# Patient Record
Sex: Male | Born: 1985 | Race: Black or African American | Hispanic: No | Marital: Married | Smoking: Never smoker
Health system: Southern US, Community
[De-identification: ages and names within clinical notes are randomized; demographics above are authoritative.]

## PROBLEM LIST (undated history)

## (undated) DIAGNOSIS — I10 Essential (primary) hypertension: Secondary | ICD-10-CM

## (undated) HISTORY — PX: KNEE SURGERY: SHX244

---

## 2007-02-06 ENCOUNTER — Emergency Department (HOSPITAL_COMMUNITY): Admission: EM | Admit: 2007-02-06 | Discharge: 2007-02-06 | Payer: Self-pay | Admitting: Emergency Medicine

## 2008-11-15 ENCOUNTER — Emergency Department (HOSPITAL_COMMUNITY): Admission: EM | Admit: 2008-11-15 | Discharge: 2008-11-15 | Payer: Self-pay | Admitting: Emergency Medicine

## 2008-11-28 ENCOUNTER — Emergency Department (HOSPITAL_COMMUNITY): Admission: EM | Admit: 2008-11-28 | Discharge: 2008-11-28 | Payer: Self-pay | Admitting: Emergency Medicine

## 2009-04-26 ENCOUNTER — Emergency Department (HOSPITAL_BASED_OUTPATIENT_CLINIC_OR_DEPARTMENT_OTHER): Admission: EM | Admit: 2009-04-26 | Discharge: 2009-04-26 | Payer: Self-pay | Admitting: Emergency Medicine

## 2015-05-17 ENCOUNTER — Emergency Department (HOSPITAL_COMMUNITY)
Admission: EM | Admit: 2015-05-17 | Discharge: 2015-05-17 | Disposition: A | Discharge status: Home / Self Care | Payer: 59 | Attending: Emergency Medicine | Admitting: Emergency Medicine

## 2015-05-17 ENCOUNTER — Encounter (HOSPITAL_COMMUNITY): Payer: Self-pay | Admitting: Emergency Medicine

## 2015-05-17 DIAGNOSIS — Y9389 Activity, other specified: Secondary | ICD-10-CM | POA: Insufficient documentation

## 2015-05-17 DIAGNOSIS — Y999 Unspecified external cause status: Secondary | ICD-10-CM | POA: Diagnosis not present

## 2015-05-17 DIAGNOSIS — I10 Essential (primary) hypertension: Secondary | ICD-10-CM | POA: Insufficient documentation

## 2015-05-17 DIAGNOSIS — Y92410 Unspecified street and highway as the place of occurrence of the external cause: Secondary | ICD-10-CM | POA: Diagnosis not present

## 2015-05-17 DIAGNOSIS — M546 Pain in thoracic spine: Secondary | ICD-10-CM

## 2015-05-17 DIAGNOSIS — S299XXA Unspecified injury of thorax, initial encounter: Secondary | ICD-10-CM | POA: Insufficient documentation

## 2015-05-17 DIAGNOSIS — S3992XA Unspecified injury of lower back, initial encounter: Secondary | ICD-10-CM | POA: Diagnosis present

## 2015-05-17 DIAGNOSIS — M545 Low back pain, unspecified: Secondary | ICD-10-CM

## 2015-05-17 MED ORDER — KETOROLAC TROMETHAMINE 60 MG/2ML IM SOLN
60.0000 mg | Freq: Once | INTRAMUSCULAR | Status: AC
  Administered 2015-05-17: 60 mg via INTRAMUSCULAR
  Filled 2015-05-17: qty 2

## 2015-05-17 MED ORDER — DIAZEPAM 2 MG PO TABS
2.0000 mg | ORAL_TABLET | Freq: Once | ORAL | Status: AC
  Administered 2015-05-17: 2 mg via ORAL
  Filled 2015-05-17: qty 1

## 2015-05-17 MED ORDER — CYCLOBENZAPRINE HCL 10 MG PO TABS: 10.0000 mg | ORAL_TABLET | Freq: Two times a day (BID) | ORAL | Status: DC | PRN

## 2015-05-17 MED ORDER — IBUPROFEN 800 MG PO TABS: 800.0000 mg | ORAL_TABLET | Freq: Three times a day (TID) | ORAL | Status: DC

## 2015-05-17 MED ORDER — HYDROCODONE-ACETAMINOPHEN 5-325 MG PO TABS: 1.0000 | ORAL_TABLET | ORAL | Status: DC | PRN
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
## 2015-05-17 NOTE — Progress Notes (Signed)
  [facility] ED CM spoke with pt on how to obtain an in network pcp with insurance coverage via the customer service number or web site His wife works for [employer] Fast track RN, Maryruth Hancocknn Marie spoke with pt about getting wife to assist with finding a doctor to f/u with  Cm reviewed ED level of care for crisis/emergent services and community pcp level of care to manage continuous or chronic medical concerns.  The pt voiced understanding CM encouraged pt and discussed pt's responsibility to verify with pt's insurance carrier that any recommended medical provider offered by any emergency room or a hospital provider is within the carrier's network. The pt voiced understanding

## 2015-10-21 ENCOUNTER — Emergency Department (HOSPITAL_COMMUNITY)
Admission: EM | Admit: 2015-10-21 | Discharge: 2015-10-21 | Disposition: A | Discharge status: Home / Self Care | Payer: Medicaid Other | Attending: Emergency Medicine | Admitting: Emergency Medicine

## 2015-10-21 ENCOUNTER — Encounter (HOSPITAL_COMMUNITY): Payer: Self-pay

## 2015-10-21 ENCOUNTER — Emergency Department (HOSPITAL_COMMUNITY): Payer: Medicaid Other

## 2015-10-21 DIAGNOSIS — Y9389 Activity, other specified: Secondary | ICD-10-CM | POA: Diagnosis not present

## 2015-10-21 DIAGNOSIS — S60121A Contusion of right index finger with damage to nail, initial encounter: Secondary | ICD-10-CM | POA: Insufficient documentation

## 2015-10-21 DIAGNOSIS — Z791 Long term (current) use of non-steroidal anti-inflammatories (NSAID): Secondary | ICD-10-CM | POA: Diagnosis not present

## 2015-10-21 DIAGNOSIS — S6000XA Contusion of unspecified finger without damage to nail, initial encounter: Secondary | ICD-10-CM

## 2015-10-21 DIAGNOSIS — S6010XA Contusion of unspecified finger with damage to nail, initial encounter: Secondary | ICD-10-CM

## 2015-10-21 DIAGNOSIS — S6991XA Unspecified injury of right wrist, hand and finger(s), initial encounter: Secondary | ICD-10-CM | POA: Diagnosis present

## 2015-10-21 DIAGNOSIS — I10 Essential (primary) hypertension: Secondary | ICD-10-CM | POA: Diagnosis not present

## 2015-10-21 DIAGNOSIS — Y998 Other external cause status: Secondary | ICD-10-CM | POA: Insufficient documentation

## 2015-10-21 DIAGNOSIS — W231XXA Caught, crushed, jammed, or pinched between stationary objects, initial encounter: Secondary | ICD-10-CM | POA: Diagnosis not present

## 2015-10-21 DIAGNOSIS — Y9289 Other specified places as the place of occurrence of the external cause: Secondary | ICD-10-CM | POA: Insufficient documentation

## 2015-10-21 HISTORY — DX: Essential (primary) hypertension: I10

## 2015-10-21 MED ORDER — HYDROCODONE-ACETAMINOPHEN 5-325 MG PO TABS
1.0000 | ORAL_TABLET | Freq: Once | ORAL | Status: AC
  Administered 2015-10-21: 1 via ORAL
  Filled 2015-10-21: qty 1

## 2015-10-21 MED ORDER — HYDROCODONE-ACETAMINOPHEN 5-325 MG PO TABS: 2.0000 | ORAL_TABLET | ORAL | Status: DC | PRN

## 2015-10-21 NOTE — Discharge Instructions (Signed)
Contusion A contusion is a deep bruise. Contusions are the result of a blunt injury to tissues and muscle fibers under the skin. The injury causes bleeding under the skin. The skin overlying the contusion may turn blue, purple, or yellow. Minor injuries will give you a painless contusion, but more severe contusions may stay painful and swollen for a few weeks.  CAUSES  This condition is usually caused by a blow, trauma, or direct force to an area of the body. SYMPTOMS  Symptoms of this condition include:  Swelling of the injured area.  Pain and tenderness in the injured area.  Discoloration. The area may have redness and then turn blue, purple, or yellow. DIAGNOSIS  This condition is diagnosed based on a physical exam and medical history. An X-ray, CT scan, or MRI may be needed to determine if there are any associated injuries, such as broken bones (fractures). TREATMENT  Specific treatment for this condition depends on what area of the body was injured. In general, the best treatment for a contusion is resting, icing, applying pressure to (compression), and elevating the injured area. This is often called the RICE strategy. Over-the-counter anti-inflammatory medicines may also be recommended for pain control.  HOME CARE INSTRUCTIONS   Rest the injured area.  If directed, apply ice to the injured area:  Put ice in a plastic bag.  Place a towel between your skin and the bag.  Leave the ice on for 20 minutes, 2-3 times per day.  If directed, apply light compression to the injured area using an elastic bandage. Make sure the bandage is not wrapped too tightly. Remove and reapply the bandage as directed by your health care provider.  If possible, raise (elevate) the injured area above the level of your heart while you are sitting or lying down.  Take over-the-counter and prescription medicines only as told by your health care provider. SEEK MEDICAL CARE IF:  Your symptoms do not  improve after several days of treatment.  Your symptoms get worse.  You have difficulty moving the injured area. SEEK IMMEDIATE MEDICAL CARE IF:   You have severe pain.  You have numbness in a hand or foot.  Your hand or foot turns pale or cold.   This information is not intended to replace advice given to you by your health care provider. Make sure you discuss any questions you have with your health care provider.   Document Released: 08/05/2005 Document Revised: 07/17/2015 Document Reviewed: 03/13/2015 [employer] Interactive Patient Education 2016 [employer]   Subungual Hematoma A subungual hematoma is a pocket of blood that collects under the fingernail or toenail. The pressure created by the blood under the nail can cause pain. CAUSES  A subungual hematoma occurs when an injury to the finger or toe causes a blood vessel beneath the nail to break. The injury can occur from a direct blow such as slamming a finger in a door. It can also occur from a repeated injury such as pressure on the foot in a shoe while running. A subungual hematoma is sometimes called runner's toe or tennis toe. SYMPTOMS   Blue or dark blue skin under the nail.  Pain or throbbing in the injured area. DIAGNOSIS  Your caregiver can determine whether you have a subungual hematoma based on your history and a physical exam. If your caregiver thinks you might have a broken (fractured) bone, X-rays may be taken. TREATMENT  Hematomas usually go away on their own over time. Your caregiver may  make a hole in the nail to drain the blood. Draining the blood is painless and usually provides significant relief from pain and throbbing. The nail usually grows back normally after this procedure. In some cases, the nail may need to be removed. This is done if there is a cut under the nail that requires stitches (sutures). HOME CARE INSTRUCTIONS   Put ice on the injured area.  Put ice in a plastic bag.  Place a towel  between your skin and the bag.  Leave the ice on for 15-20 minutes, 03-04 times a day for the first 1 to 2 days.  Elevate the injured area to help decrease pain and swelling.  If you were given a bandage, wear it for as long as directed by your caregiver.  If part of your nail falls off, trim the remaining nail gently. This prevents the nail from catching on something and causing further injury.  Only take over-the-counter or prescription medicines for pain, discomfort, or fever as directed by your caregiver. SEEK IMMEDIATE MEDICAL CARE IF:   You have redness or swelling around the nail.  You have yellowish-white fluid (pus) coming from the nail.  Your pain is not controlled with medicine.  You have a fever. MAKE SURE YOU:  Understand these instructions.  Will watch your condition.  Will get help right away if you are not doing well or get worse.   This information is not intended to replace advice given to you by your health care provider. Make sure you discuss any questions you have with your health care provider.   Apply ice to affected area. Follow up with orthopedic provider if symptoms do not improve within 1-2 weeks. Return to the Emergency Department if you experience worsening of your symptoms, fever, numbness in you extremity.
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
## 2015-10-21 NOTE — ED Provider Notes (Signed)
CSN: [id number]     Arrival date & time 10/21/15  1302 History  By signing my name below, I, Jarvis Morganaylor Ferguson, attest that this documentation has been prepared under the direction and in the presence of Dub MikesSamantha Tripp Dowless, PA-C  Electronically Signed: Jarvis Morganaylor Ferguson, ED Scribe. 10/21/2015. 1:48 PM.    Chief Complaint  Patient presents with  . Finger Injury   The history is provided by the patient. No language interpreter was used.   HPI Comments: Michael Arnold is a 29 y.o. male who presents to the Emergency Department complaining of constant, moderate, throbbing, right index finger pain s/p slamming tip of right index finger in a door today. He reports associated swelling and bruising to the finger. He states the pain is exacerbated with moving the finger and applied pressure to the finger. Pt denies any alleviating factors. He denies any prior injury to the finger. He denies any numbness, tingling, weakness or other associated symptoms at this time.  Past Medical History  Diagnosis Date  . Hypertension    Past Surgical History  Procedure Laterality Date  . Knee surgery     History reviewed. No pertinent family history. Social History  Substance Use Topics  . Smoking status: Never Smoker   . Smokeless tobacco: None  . Alcohol Use: No    Review of Systems  All other systems reviewed and are negative.     Allergies  Review of patient's allergies indicates no known allergies.  Home Medications   Prior to Admission medications   Medication Sig Start Date End Date Taking? Authorizing Provider  cyclobenzaprine (FLEXERIL) 10 MG tablet Take 1 tablet (10 mg total) by mouth 2 (two) times daily as needed for muscle spasms. 05/17/15   Danelle BerryLeisa Tapia, PA-C  HYDROcodone-acetaminophen (NORCO/VICODIN) 5-325 MG per tablet Take 1-2 tablets by mouth every 4 (four) hours as needed. 05/17/15   Danelle BerryLeisa Tapia, PA-C  ibuprofen (ADVIL,MOTRIN) 800 MG tablet Take 1 tablet (800 mg total) by mouth 3  (three) times daily. 05/17/15   Danelle BerryLeisa Tapia, PA-C   Triage Vitals: BP 168/106 mmHg  Pulse 75  Temp(Src) 98.6 F (37 C) (Oral)  Resp 18  SpO2 97%  Physical Exam  Constitutional: He is oriented to person, place, and time. He appears well-developed and well-nourished. No distress.  HENT:  Head: Normocephalic and atraumatic.  Eyes: Conjunctivae are normal. Right eye exhibits no discharge. Left eye exhibits no discharge. No scleral icterus.  Cardiovascular: Normal rate.   Pulmonary/Chest: Effort normal.  Musculoskeletal:       Hands: R hand/R index finger:No evidence of tendon injury. No obvioius bony deformity. No decrease ROM.   Subungual hematoma present on R index finger.   Neurological: He is alert and oriented to person, place, and time. Coordination normal.  Skin: Skin is warm and dry. No rash noted. He is not diaphoretic. No erythema. No pallor.  Psychiatric: He has a normal mood and affect. His behavior is normal.  Nursing note and vitals reviewed.   ED Course  Procedures (including critical care time)  DIAGNOSTIC STUDIES: Oxygen Saturation is 97% on RA, normal by my interpretation.    COORDINATION OF CARE: 1:13 PM- Will order imaging of right index finger.  Pt advised of plan for treatment and pt agrees.  Labs Review Labs Reviewed - No data to display  Imaging Review Dg Finger Index Right  10/21/2015  CLINICAL DATA:  Close right index finger in car door this morning. Pain, bruising and swelling at DIP joint. EXAM:  RIGHT INDEX FINGER 2+V COMPARISON:  None. FINDINGS: There is no evidence of fracture or dislocation. There is no evidence of arthropathy or other focal bone abnormality. Soft tissues are unremarkable. IMPRESSION: Negative. Electronically Signed   By: Charlett Nose M.D.   On: 10/21/2015 13:35      EKG Interpretation None      MDM   Final diagnoses:  Finger contusion, initial encounter  Subungual hematoma of digit of hand, initial encounter     Patient X-Ray negative for obvious fracture or dislocation. Subungual hematoma present on R index finger. Pain managed in ED. Pt advised to follow up with orthopedics if symptoms persist for possibility of missed fracture diagnosis. Conservative therapy recommended and discussed. Patient will be dc home & is agreeable with above plan.   I personally performed the services described in this documentation, which was scribed in my presence. The recorded information has been reviewed and is accurate.      Lester Kinsman Goodridge, PA-C 10/22/15 1551  Tilden Fossa, MD 10/23/15 [phone]

## 2016-06-27 IMAGING — CR DG FINGER INDEX 2+V*R*
3 series · 3 of 3 positions shown · non-contrast
Comparison: None.

CLINICAL DATA: Close right index finger in car door this morning.
Pain, bruising and swelling at DIP joint.

EXAM:
RIGHT INDEX FINGER 2+V

[x finger pa right]
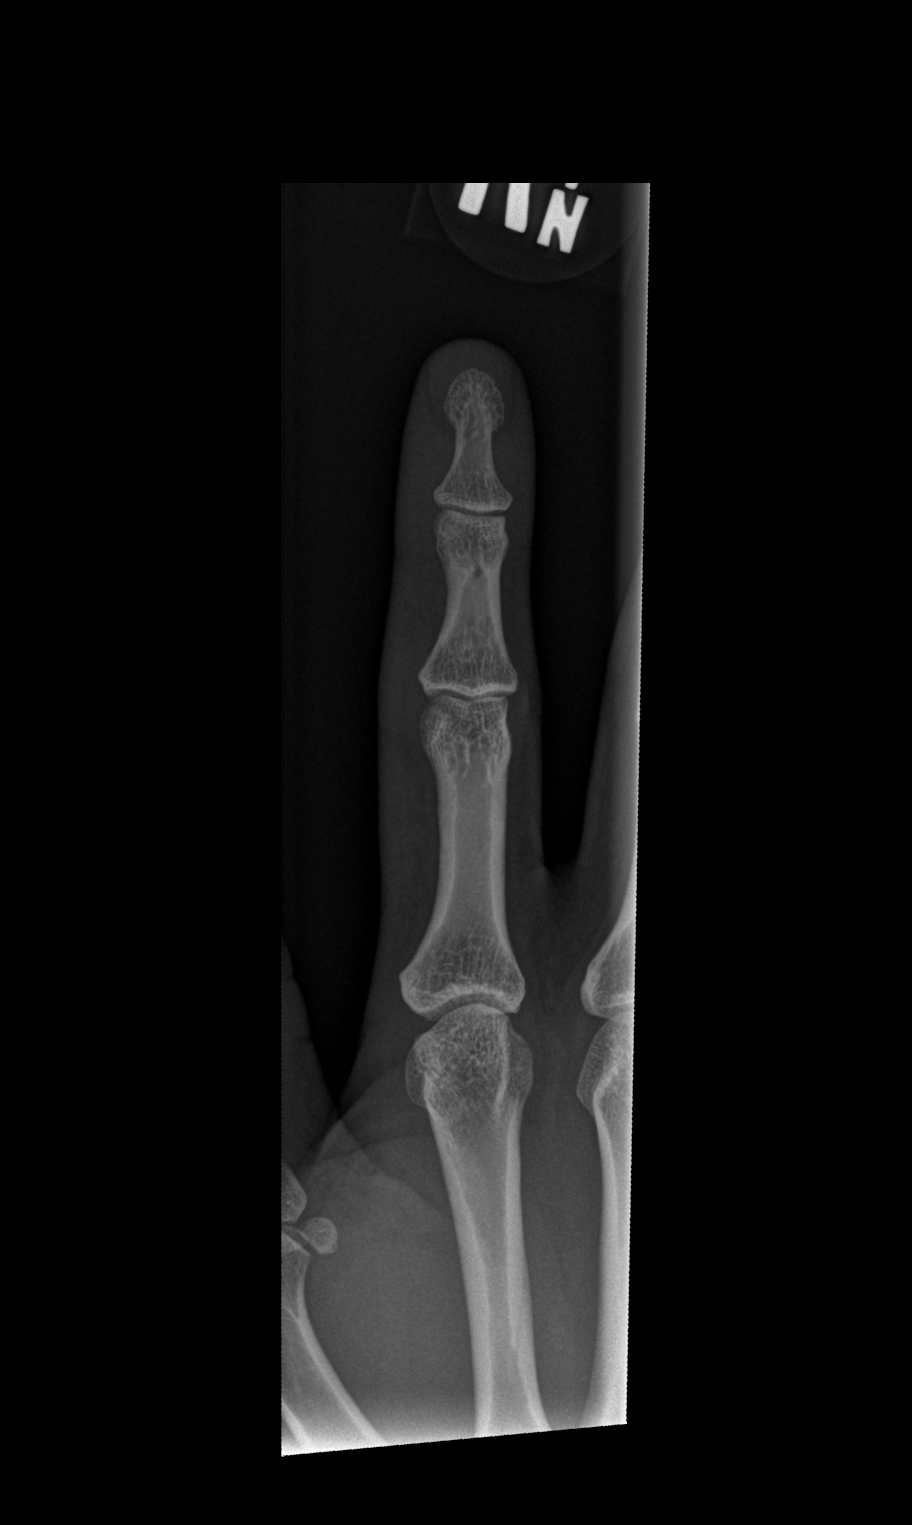

[x finger obl right]
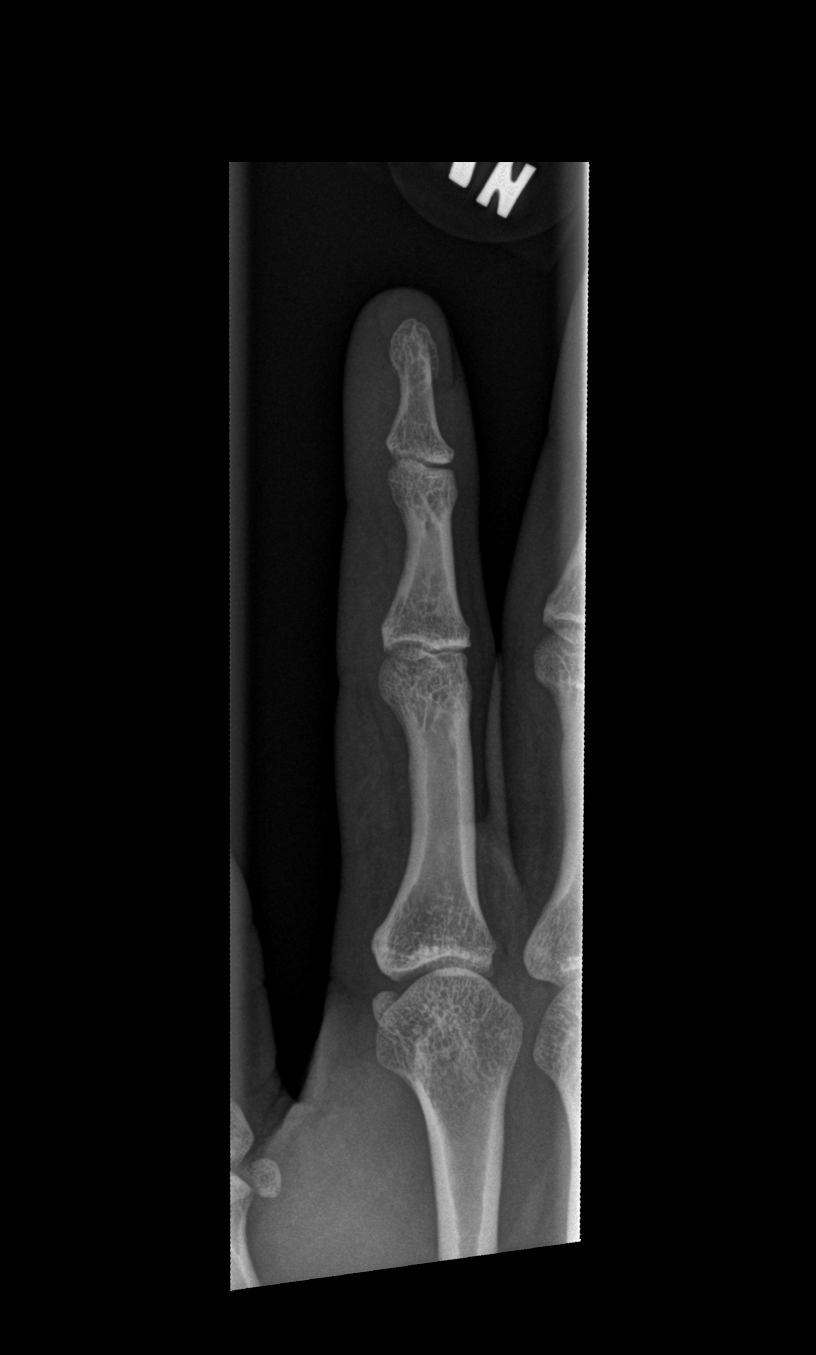

[x finger lat right]
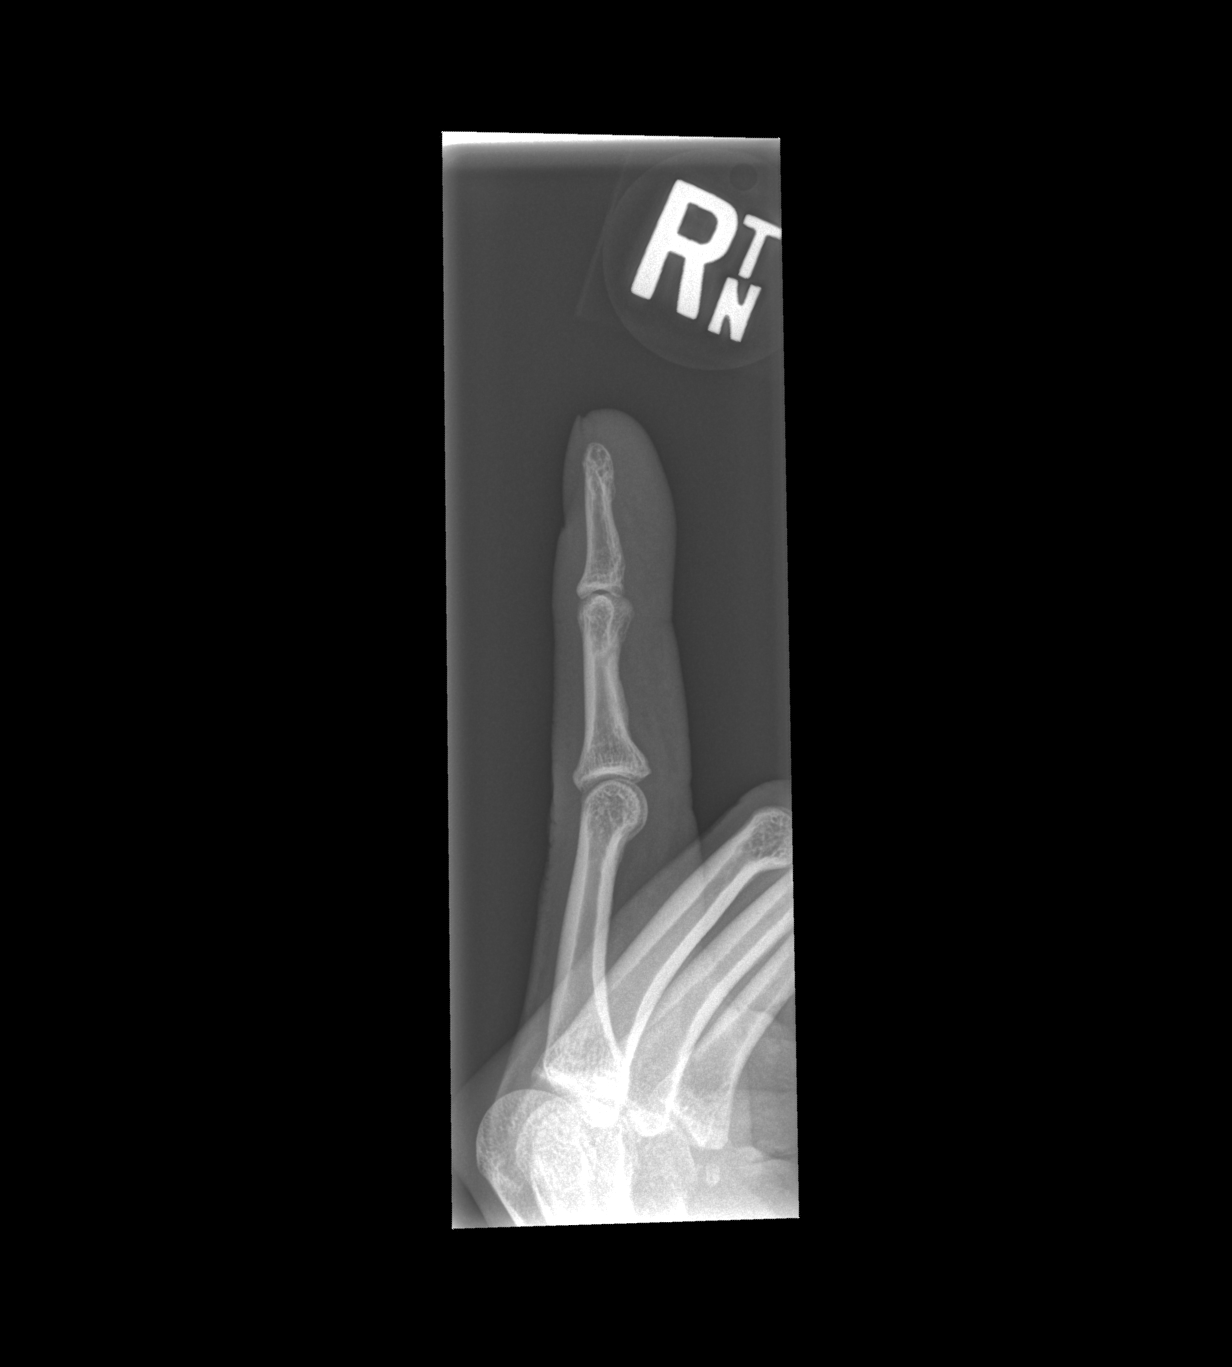

[3 of 3 positions shown; findings below may reference images not displayed]

FINDINGS: There is no evidence of fracture or dislocation. There is no
evidence of arthropathy or other focal bone abnormality. Soft
tissues are unremarkable.
IMPRESSION: Negative.

## 2018-04-27 ENCOUNTER — Other Ambulatory Visit: Payer: Self-pay

## 2018-04-27 ENCOUNTER — Emergency Department (HOSPITAL_COMMUNITY)
Admission: EM | Admit: 2018-04-27 | Discharge: 2018-04-28 | Disposition: A | Discharge status: Home / Self Care | Payer: Commercial Managed Care - PPO | Attending: Emergency Medicine | Admitting: Emergency Medicine

## 2018-04-27 ENCOUNTER — Encounter (HOSPITAL_COMMUNITY): Payer: Self-pay | Admitting: *Deleted

## 2018-04-27 DIAGNOSIS — J189 Pneumonia, unspecified organism: Secondary | ICD-10-CM | POA: Insufficient documentation

## 2018-04-27 DIAGNOSIS — I10 Essential (primary) hypertension: Secondary | ICD-10-CM | POA: Insufficient documentation

## 2018-04-27 DIAGNOSIS — R05 Cough: Secondary | ICD-10-CM | POA: Diagnosis present

## 2018-04-27 NOTE — ED Triage Notes (Signed)
Pt c/o cough x 1 week, productive & clear, & having headache.

## 2018-04-28 ENCOUNTER — Emergency Department (HOSPITAL_COMMUNITY): Payer: Commercial Managed Care - PPO

## 2018-04-28 MED ORDER — ONDANSETRON 4 MG PO TBDP: 4.0000 mg | ORAL_TABLET | Freq: Three times a day (TID) | ORAL | 0 refills | Status: DC | PRN

## 2018-04-28 MED ORDER — CEFTRIAXONE SODIUM 1 G IJ SOLR
1.0000 g | Freq: Once | INTRAMUSCULAR | Status: AC
  Administered 2018-04-28: 1 g via INTRAMUSCULAR
  Filled 2018-04-28: qty 10

## 2018-04-28 MED ORDER — ALBUTEROL SULFATE HFA 108 (90 BASE) MCG/ACT IN AERS
2.0000 | INHALATION_SPRAY | Freq: Once | RESPIRATORY_TRACT | Status: AC
  Administered 2018-04-28: 2 via RESPIRATORY_TRACT
  Filled 2018-04-28: qty 6.7

## 2018-04-28 MED ORDER — DOXYCYCLINE HYCLATE 100 MG PO CAPS: 100.0000 mg | ORAL_CAPSULE | Freq: Two times a day (BID) | ORAL | 0 refills | Status: AC

## 2018-04-28 MED ORDER — PROMETHAZINE-CODEINE 6.25-10 MG/5ML PO SYRP: 5.0000 mL | ORAL_SOLUTION | ORAL | 0 refills | Status: DC | PRN

## 2018-04-28 MED ORDER — LIDOCAINE HCL 1 % IJ SOLN
INTRAMUSCULAR | Status: AC
  Administered 2018-04-28: 20 mL
  Filled 2018-04-28: qty 20

## 2018-04-28 MED ORDER — HYDROCODONE-ACETAMINOPHEN 5-325 MG PO TABS
2.0000 | ORAL_TABLET | Freq: Once | ORAL | Status: AC
  Administered 2018-04-28: 2 via ORAL
  Filled 2018-04-28: qty 2

## 2018-04-28 MED ORDER — DOXYCYCLINE HYCLATE 100 MG PO TABS
100.0000 mg | ORAL_TABLET | Freq: Once | ORAL | Status: AC
  Administered 2018-04-28: 100 mg via ORAL
  Filled 2018-04-28: qty 1

## 2018-04-28 MED ORDER — PROMETHAZINE HCL 25 MG PO TABS
25.0000 mg | ORAL_TABLET | Freq: Once | ORAL | Status: AC
  Administered 2018-04-28: 25 mg via ORAL
  Filled 2018-04-28: qty 1

## 2018-04-28 NOTE — ED Provider Notes (Signed)
[facility]-EMERGENCY DEPT Provider Note   CSN: [id number] Arrival date & time: 04/27/18  2251     History   Chief Complaint Chief Complaint  Patient presents with  . Cough    HPI Michael Arnold is a 32 y.o. male.  HPI    32 year old well-appearing male here with cough.  The patient states that for the last week, he has had symptoms that initially started with chills, diffuse body aches, and cough.  His body aches and fevers have resolved but has had persistent cough with sputum production since then.  He is also had several episodes of post tussive emesis.  He has had poor appetite.  Is been going to work but states he has been very tired at work which is abnormal for him.  Denies any persistent fevers.  No known sick contacts.  Denies any pain currently.  No history of lung disease.  He does not smoke.  He does note that he has an occasional wheeze at home.  Denies any shortness of breath at rest.  No dyspnea with exertion.  No syncope.  Is been taking over-the-counter cough relief without significant improvement.  Past Medical History:  Diagnosis Date  . Hypertension     There are no active problems to display for this patient.   Past Surgical History:  Procedure Laterality Date  . KNEE SURGERY          Home Medications    Prior to Admission medications   Medication Sig Start Date End Date Taking? Authorizing Provider  cyclobenzaprine (FLEXERIL) 10 MG tablet Take 1 tablet (10 mg total) by mouth 2 (two) times daily as needed for muscle spasms. 05/17/15   Danelle Berryapia, Leisa, PA-C  doxycycline (VIBRAMYCIN) 100 MG capsule Take 1 capsule (100 mg total) by mouth 2 (two) times daily for 7 days. 04/28/18 05/05/18  Shaune PollackIsaacs, Kem Hensen, MD  HYDROcodone-acetaminophen (NORCO/VICODIN) 5-325 MG per tablet Take 1-2 tablets by mouth every 4 (four) hours as needed. 05/17/15   Danelle Berryapia, Leisa, PA-C  HYDROcodone-acetaminophen (NORCO/VICODIN) 5-325 MG tablet Take 2 tablets by mouth  every 4 (four) hours as needed. 10/21/15   Dowless, Lelon MastSamantha Tripp, PA-C  ibuprofen (ADVIL,MOTRIN) 800 MG tablet Take 1 tablet (800 mg total) by mouth 3 (three) times daily. 05/17/15   Danelle Berryapia, Leisa, PA-C  ondansetron (ZOFRAN ODT) 4 MG disintegrating tablet Take 1 tablet (4 mg total) by mouth every 8 (eight) hours as needed for nausea or vomiting. 04/28/18   Shaune PollackIsaacs, Anella Nakata, MD  promethazine-codeine (PHENERGAN WITH CODEINE) 6.25-10 MG/5ML syrup Take 5 mLs by mouth every 4 (four) hours as needed for cough. 04/28/18   Shaune PollackIsaacs, Jini Horiuchi, MD    Family History No family history on file.  Social History Social History   Tobacco Use  . Smoking status: Never Smoker  Substance Use Topics  . Alcohol use: No  . Drug use: No     Allergies   Patient has no known allergies.   Review of Systems Review of Systems  Constitutional: Positive for fatigue. Negative for chills and fever.  HENT: Negative for congestion and rhinorrhea.   Eyes: Negative for visual disturbance.  Respiratory: Positive for cough and shortness of breath. Negative for wheezing.   Cardiovascular: Negative for chest pain and leg swelling.  Gastrointestinal: Negative for abdominal pain, diarrhea, nausea and vomiting.  Genitourinary: Negative for dysuria and flank pain.  Musculoskeletal: Negative for neck pain and neck stiffness.  Skin: Negative for rash and wound.  Allergic/Immunologic: Negative for immunocompromised state.  Neurological: Positive for weakness. Negative for syncope and headaches.  All other systems reviewed and are negative.    Physical Exam Updated Vital Signs BP (!) 182/117 (BP Location: Right Arm)   Pulse 98   Temp 98.3 F (36.8 C) (Oral)   Resp 16   Ht 5\' 11"  (1.803 m)   Wt 88.5 kg (195 lb)   SpO2 96%   BMI 27.20 kg/m   Physical Exam  Constitutional: He is oriented to person, place, and time. He appears well-developed and well-nourished. No distress.  HENT:  Head: Normocephalic and atraumatic.    Mouth/Throat: Oropharynx is clear and moist.  Eyes: Conjunctivae are normal.  Neck: Neck supple.  Cardiovascular: Normal rate, regular rhythm and normal heart sounds. Exam reveals no friction rub.  No murmur heard. Pulmonary/Chest: Effort normal. No respiratory distress. He has no wheezes. He has rales (Mild, bibasilarly clear with coughing and occasional wheezes).  Abdominal: He exhibits no distension.  Musculoskeletal: He exhibits no edema.  Neurological: He is alert and oriented to person, place, and time. He exhibits normal muscle tone.  Skin: Skin is warm. Capillary refill takes less than 2 seconds.  Psychiatric: He has a normal mood and affect.  Nursing note and vitals reviewed.    ED Treatments / Results  Labs (all labs ordered are listed, but only abnormal results are displayed) Labs Reviewed - No data to display  EKG None  Radiology Dg Chest 2 View  Result Date: 04/28/2018 CLINICAL DATA:  32 y/o M; cough, congestion, and headache for 72 hours. Vomiting for 24 hours. EXAM: CHEST - 2 VIEW COMPARISON:  None. FINDINGS: Normal cardiac silhouette given projection and technique. Reticular opacities greatest in the lung bases. No consolidation. No pleural effusion or pneumothorax. Bones are unremarkable. IMPRESSION: Basilar predominant reticular opacities probably representing atypical pneumonia or bronchitis. No consolidation. Electronically Signed   By: Mitzi Hansen M.D.   On: 04/28/2018 00:40    Procedures Procedures (including critical care time)  Medications Ordered in ED Medications  cefTRIAXone (ROCEPHIN) injection 1 g (has no administration in time range)  doxycycline (VIBRA-TABS) tablet 100 mg (has no administration in time range)  lidocaine (XYLOCAINE) 1 % (with pres) injection (has no administration in time range)  HYDROcodone-acetaminophen (NORCO/VICODIN) 5-325 MG per tablet 2 tablet (2 tablets Oral Given 04/28/18 0048)  promethazine (PHENERGAN) tablet  25 mg (25 mg Oral Given 04/28/18 0048)  albuterol (PROVENTIL HFA;VENTOLIN HFA) 108 (90 Base) MCG/ACT inhaler 2 puff (2 puffs Inhalation Given 04/28/18 0049)     Initial Impression / Assessment and Plan / ED Course  I have reviewed the triage vital signs and the nursing notes.  Pertinent labs & imaging results that were available during my care of the patient were reviewed by me and considered in my medical decision making (see chart for details).     32 year old well-appearing male here with cough and sputum production for 1 week.  Chest x-ray is consistent with atypical pneumonia.  Patient is afebrile, hemodynamically stable and not hypoxic here.  Will treat with supportive care and a course of doxycycline.  Of also given him an IM dose of antibiotics here given his vomiting.  He was given Phenergan which has improved his nausea and is been able to eat and drink without difficulty.  Will also trial albuterol given his occasional wheezes.  Otherwise, patient is notably hypertensive here.  He states this is always the case when he is at the doctor and he declines any further work-up or  treatment.  No headache or signs of hypertensive urgency.  Final Clinical Impressions(s) / ED Diagnoses   Final diagnoses:  Community acquired pneumonia, unspecified laterality    ED Discharge Orders        Ordered    doxycycline (VIBRAMYCIN) 100 MG capsule  2 times daily     04/28/18 0137    promethazine-codeine (PHENERGAN WITH CODEINE) 6.25-10 MG/5ML syrup  Every 4 hours PRN     04/28/18 0137    ondansetron (ZOFRAN ODT) 4 MG disintegrating tablet  Every 8 hours PRN     04/28/18 0137       Shaune Pollack, MD 04/28/18 [phone]

## 2019-01-03 IMAGING — CR DG CHEST 2V
2 series · 2 of 2 positions shown · non-contrast
Comparison: None.

CLINICAL DATA: 31 y/o M; cough, congestion, and headache for 72
hours. Vomiting for 24 hours.

EXAM:
CHEST - 2 VIEW

[w chest pa]
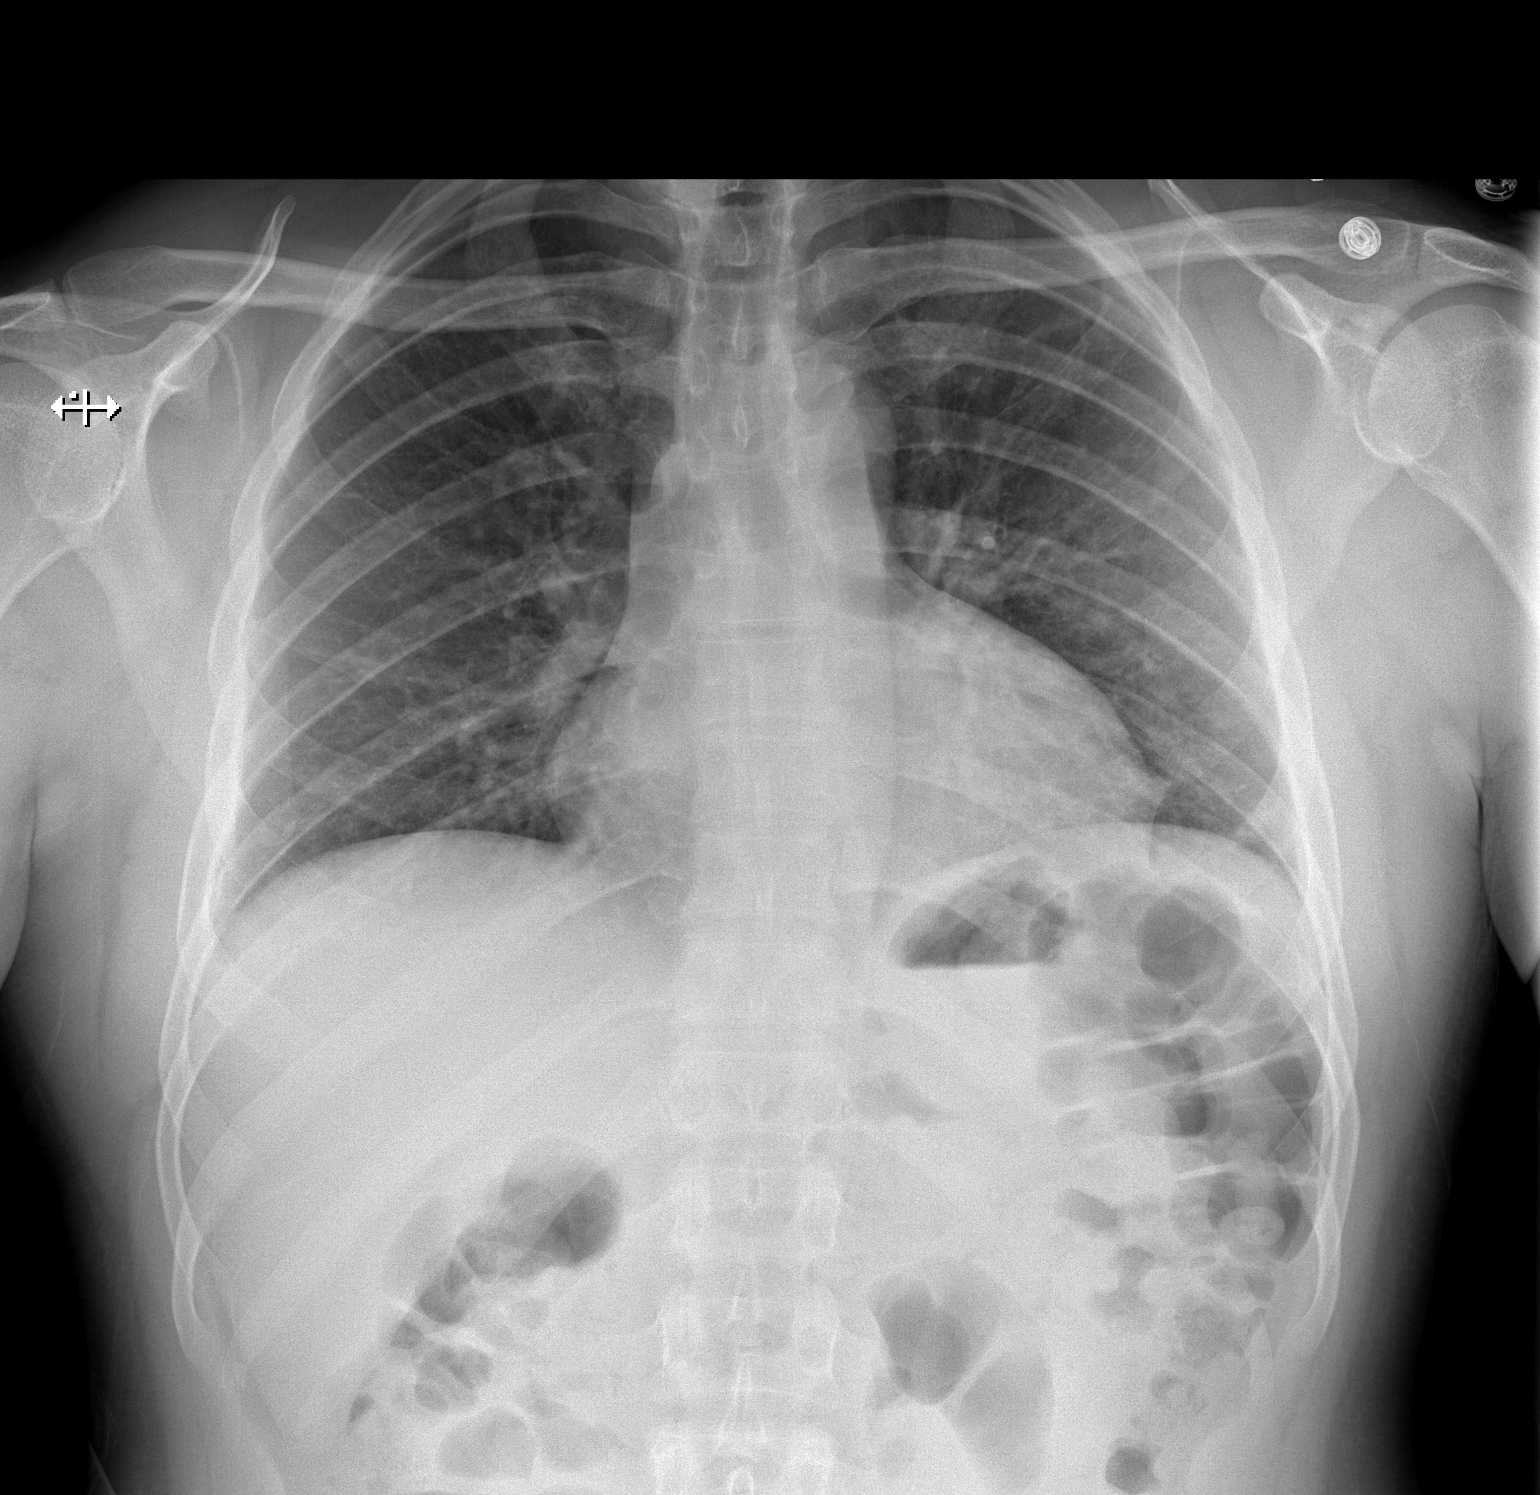

[w chest lat]
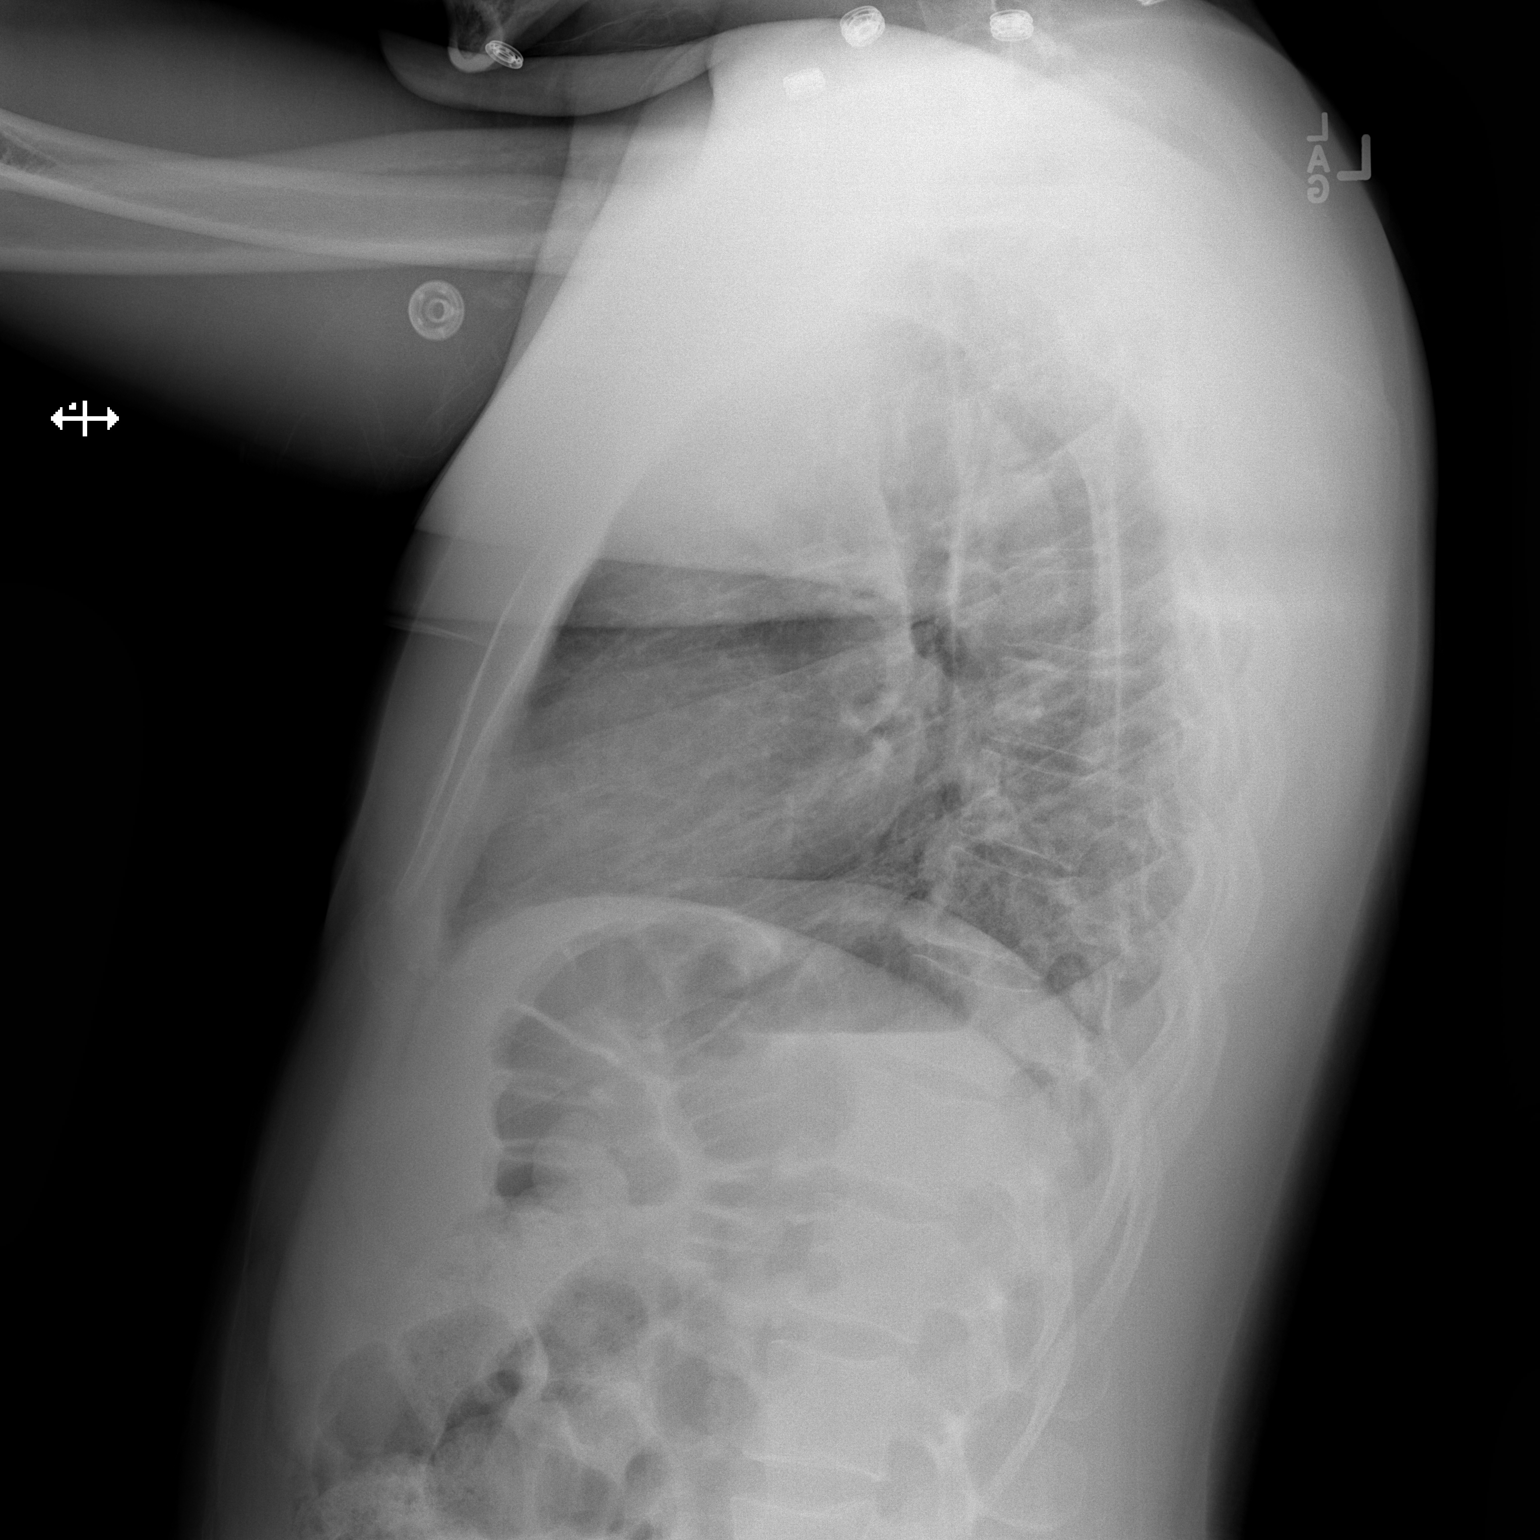

[2 of 2 positions shown; findings below may reference images not displayed]

FINDINGS: Normal cardiac silhouette given projection and technique. Reticular
opacities greatest in the lung bases. No consolidation. No pleural
effusion or pneumothorax. Bones are unremarkable.
IMPRESSION: Basilar predominant reticular opacities probably representing
atypical pneumonia or bronchitis. No consolidation.

By: Nadeem Grafton M.D.

## 2019-08-15 ENCOUNTER — Other Ambulatory Visit: Payer: Self-pay

## 2019-08-15 DIAGNOSIS — Z20822 Contact with and (suspected) exposure to covid-19: Secondary | ICD-10-CM

## 2019-08-18 LAB — NOVEL CORONAVIRUS, NAA: SARS-CoV-2, NAA: NOT DETECTED

## 2020-02-15 ENCOUNTER — Ambulatory Visit: Payer: Commercial Managed Care - PPO | Attending: Family

## 2020-02-15 DIAGNOSIS — Z23 Encounter for immunization: Secondary | ICD-10-CM

## 2020-02-15 NOTE — Progress Notes (Signed)
   Covid-19 Vaccination Clinic  Name:  Michael Arnold    MRN: [id number] DOB: October 17, 1986  02/15/2020  [name] was observed post Covid-19 immunization for 15 minutes without incident. He was provided with Vaccine Information Sheet and instruction to access the V-Safe system.   [name] was instructed to call 911 with any severe reactions post vaccine: Marland Kitchen Difficulty breathing  . Swelling of face and throat  . A fast heartbeat  . A bad rash all over body  . Dizziness and weakness   Immunizations Administered    Name Date Dose VIS Date Route   Moderna COVID-19 Vaccine 02/15/2020 11:02 AM 0.5 mL 10/10/2019 Intramuscular   Manufacturer: Moderna   Lot: [id number]   NDC: 46286-381-77

## 2020-03-19 ENCOUNTER — Ambulatory Visit: Payer: Commercial Managed Care - PPO | Attending: Family

## 2020-03-19 DIAGNOSIS — Z23 Encounter for immunization: Secondary | ICD-10-CM

## 2020-03-19 NOTE — Progress Notes (Signed)
   Covid-19 Vaccination Clinic  Name:  Slayton Lubitz    MRN: [id number] DOB: 1986/08/05  03/19/2020  Mr. Stannard was observed post Covid-19 immunization for 15 minutes without incident. He was provided with Vaccine Information Sheet and instruction to access the V-Safe system.   Mr. Griffith was instructed to call 911 with any severe reactions post vaccine: Marland Kitchen Difficulty breathing  . Swelling of face and throat  . A fast heartbeat  . A bad rash all over body  . Dizziness and weakness   Immunizations Administered    Name Date Dose VIS Date Route   Moderna COVID-19 Vaccine 03/19/2020 10:26 AM 0.5 mL 10/2019 Intramuscular   Manufacturer: Moderna   Lot: [id number]   NDC: 45364-680-32

## 2021-12-25 ENCOUNTER — Encounter: Payer: Self-pay | Admitting: Family Medicine

## 2021-12-25 ENCOUNTER — Other Ambulatory Visit: Payer: Self-pay

## 2021-12-25 ENCOUNTER — Telehealth: Payer: Self-pay

## 2021-12-25 ENCOUNTER — Emergency Department (INDEPENDENT_AMBULATORY_CARE_PROVIDER_SITE_OTHER)
Admission: EM | Admit: 2021-12-25 | Discharge: 2021-12-25 | Disposition: A | Discharge status: Home / Self Care | Payer: BC Managed Care – PPO | Source: Home / Self Care

## 2021-12-25 DIAGNOSIS — J309 Allergic rhinitis, unspecified: Secondary | ICD-10-CM | POA: Diagnosis not present

## 2021-12-25 DIAGNOSIS — R059 Cough, unspecified: Secondary | ICD-10-CM

## 2021-12-25 DIAGNOSIS — J0100 Acute maxillary sinusitis, unspecified: Secondary | ICD-10-CM | POA: Diagnosis not present

## 2021-12-25 MED ORDER — BENZONATATE 200 MG PO CAPS: 200.0000 mg | ORAL_CAPSULE | Freq: Three times a day (TID) | ORAL | 0 refills | Status: AC | PRN

## 2021-12-25 MED ORDER — AMOXICILLIN-POT CLAVULANATE 875-125 MG PO TABS: 1.0000 | ORAL_TABLET | Freq: Two times a day (BID) | ORAL | 0 refills | Status: AC

## 2021-12-25 MED ORDER — FEXOFENADINE HCL 180 MG PO TABS: 180.0000 mg | ORAL_TABLET | Freq: Every day | ORAL | 0 refills | Status: DC

## 2021-12-25 NOTE — Telephone Encounter (Signed)
Scripts sent to wrong pharmacy. Call made to [facility] to cancel. Rx resent to [facility], Pt made aware.

## 2021-12-25 NOTE — ED Triage Notes (Signed)
Pt c/o sore throat, cough and emesis x 2 weeks. Was seen at another UC on 2/6, tested neg for strep, covid and flu. Was rx'd cough meds. Says he will have a couple days where he feels improved but sxs continue to linger or worsen. Nyquil prn.

## 2021-12-25 NOTE — ED Provider Notes (Signed)
[facility]    CSN: [id number] Arrival date & time: 12/25/21  1832      History   Chief Complaint Chief Complaint  Patient presents with   Sore Throat   Cough   Emesis    HPI Michael Arnold is a 36 y.o. male.   HPI  Past Medical History:  Diagnosis Date   Hypertension     There are no problems to display for this patient.   Past Surgical History:  Procedure Laterality Date   KNEE SURGERY         Home Medications    Prior to Admission medications   Medication Sig Start Date End Date Taking? Authorizing Provider  amLODipine (NORVASC) 10 MG tablet Take by mouth. 12/12/21  Yes [provider]  amoxicillin-clavulanate (AUGMENTIN) 875-125 MG tablet Take 1 tablet by mouth 2 (two) times daily for 7 days. 12/25/21 01/01/22 Yes Eliezer Lofts, FNP  amoxicillin-clavulanate (AUGMENTIN) 875-125 MG tablet Take 1 tablet by mouth 2 (two) times daily for 7 days. 12/25/21 01/01/22 Yes Eliezer Lofts, FNP  benzonatate (TESSALON) 200 MG capsule Take 1 capsule (200 mg total) by mouth 3 (three) times daily as needed for up to 7 days for cough. 12/25/21 01/01/22 Yes Eliezer Lofts, FNP  benzonatate (TESSALON) 200 MG capsule Take 1 capsule (200 mg total) by mouth 3 (three) times daily as needed for up to 7 days for cough. 12/25/21 01/01/22 Yes Eliezer Lofts, FNP  fexofenadine [facility] ALLERGY) 180 MG tablet Take 1 tablet (180 mg total) by mouth daily for 15 days. 12/25/21 01/09/22 Yes Eliezer Lofts, FNP  fexofenadine [facility] ALLERGY) 180 MG tablet Take 1 tablet (180 mg total) by mouth daily for 15 days. 12/25/21 01/09/22 Yes Eliezer Lofts, FNP  cyclobenzaprine (FLEXERIL) 10 MG tablet Take 1 tablet (10 mg total) by mouth 2 (two) times daily as needed for muscle spasms. 05/17/15   Delsa Grana, PA-C  hydrochlorothiazide (HYDRODIURIL) 25 MG tablet Take 25 mg by mouth every morning. 12/12/21   [provider]  HYDROcodone-acetaminophen (NORCO/VICODIN) 5-325 MG per tablet Take 1-2  tablets by mouth every 4 (four) hours as needed. 05/17/15   Delsa Grana, PA-C  HYDROcodone-acetaminophen (NORCO/VICODIN) 5-325 MG tablet Take 2 tablets by mouth every 4 (four) hours as needed. 10/21/15   Dowless, Aldona Bar Tripp, PA-C  ibuprofen (ADVIL,MOTRIN) 800 MG tablet Take 1 tablet (800 mg total) by mouth 3 (three) times daily. 05/17/15   Delsa Grana, PA-C  metoprolol succinate (TOPROL-XL) 25 MG 24 hr tablet Take 25 mg by mouth daily. 12/12/21   [provider]  ondansetron (ZOFRAN ODT) 4 MG disintegrating tablet Take 1 tablet (4 mg total) by mouth every 8 (eight) hours as needed for nausea or vomiting. 04/28/18   Duffy Bruce, MD  promethazine-codeine (PHENERGAN WITH CODEINE) 6.25-10 MG/5ML syrup Take 5 mLs by mouth every 4 (four) hours as needed for cough. 04/28/18   Duffy Bruce, MD    Family History History reviewed. No pertinent family history.  Social History Social History   Tobacco Use   Smoking status: Never  Substance Use Topics   Alcohol use: No   Drug use: No     Allergies   Patient has no known allergies.   Review of Systems Review of Systems  HENT:  Positive for congestion and sore throat.   Respiratory:  Positive for cough.   All other systems reviewed and are negative.   Physical Exam Triage Vital Signs ED Triage Vitals  Enc Vitals Group  BP      Pulse      Resp      Temp      Temp src      SpO2      Weight      Height      Head Circumference      Peak Flow      Pain Score      Pain Loc      Pain Edu?      Excl. in GC?    No data found.  Updated Vital Signs BP (!) 164/100 (BP Location: Left Arm)    Pulse 92    Temp 99.4 F (37.4 C) (Oral)    Resp 18    SpO2 97%      Physical Exam Vitals and nursing note reviewed.  Constitutional:      General: He is not in acute distress.    Appearance: Normal appearance. He is obese. He is ill-appearing.  HENT:     Head: Normocephalic and atraumatic.     Right Ear: Tympanic membrane,  ear canal and external ear normal.     Left Ear: Tympanic membrane, ear canal and external ear normal.     Mouth/Throat:     Mouth: Mucous membranes are moist.     Pharynx: Oropharynx is clear.     Comments: Moderate amount of clear drainage of posterior oropharynx noted Eyes:     Extraocular Movements: Extraocular movements intact.     Conjunctiva/sclera: Conjunctivae normal.     Pupils: Pupils are equal, round, and reactive to light.  Cardiovascular:     Rate and Rhythm: Normal rate and regular rhythm.     Pulses: Normal pulses.     Heart sounds: Normal heart sounds.  Pulmonary:     Effort: Pulmonary effort is normal.     Breath sounds: Normal breath sounds.     Comments: Infrequent nonproductive cough noted on exam Musculoskeletal:     Cervical back: Normal range of motion and neck supple.  Skin:    General: Skin is warm and dry.  Neurological:     General: No focal deficit present.     Mental Status: He is alert and oriented to person, place, and time. Mental status is at baseline.     UC Treatments / Results  Labs (all labs ordered are listed, but only abnormal results are displayed) Labs Reviewed - No data to display  EKG   Radiology No results found.  Procedures Procedures (including critical care time)  Medications Ordered in UC Medications - No data to display  Initial Impression / Assessment and Plan / UC Course  I have reviewed the triage vital signs and the nursing notes.  Pertinent labs & imaging results that were available during my care of the patient were reviewed by me and considered in my medical decision making (see chart for details).     MDM: 1.  Subacute maxillary sinusitis-Rx'd Augmentin; 2.  Cough-Rx'd Tessalon Perles; 3.  Allergic rhinitis-Rx'd Allegra. Advised patient to take medication as directed with food to completion.  Advised patient to take Allegra with first dose of Augmentin for the next 5 of 7 days.  Advised may use Allegra as  needed afterwards for concurrent postnasal drainage/drip.  Advised may use Tessalon Perles daily or as needed for cough.  Encouraged patient to increase daily water intake while taking these medications.  Patient discharged home, hemodynamically stable. Final Clinical Impressions(s) / UC Diagnoses   Final diagnoses:  Subacute  maxillary sinusitis  Cough, unspecified type  Allergic rhinitis, unspecified seasonality, unspecified trigger     Discharge Instructions      Advised patient to take medication as directed with food to completion.  Advised patient to take Allegra with first dose of Augmentin for the next 5 of 7 days.  Advised may use Allegra as needed afterwards for concurrent postnasal drainage/drip.  Advised may use Tessalon Perles daily or as needed for cough.  Encouraged patient to increase daily water intake while taking these medications.     ED Prescriptions     Medication Sig Dispense Auth. Provider   amoxicillin-clavulanate (AUGMENTIN) 875-125 MG tablet Take 1 tablet by mouth 2 (two) times daily for 7 days. 14 tablet Eliezer Lofts, FNP   fexofenadine [facility] ALLERGY) 180 MG tablet Take 1 tablet (180 mg total) by mouth daily for 15 days. 15 tablet Eliezer Lofts, FNP   benzonatate (TESSALON) 200 MG capsule Take 1 capsule (200 mg total) by mouth 3 (three) times daily as needed for up to 7 days for cough. 40 capsule Eliezer Lofts, FNP   amoxicillin-clavulanate (AUGMENTIN) 875-125 MG tablet Take 1 tablet by mouth 2 (two) times daily for 7 days. 14 tablet Eliezer Lofts, FNP   fexofenadine [facility] ALLERGY) 180 MG tablet Take 1 tablet (180 mg total) by mouth daily for 15 days. 15 tablet Eliezer Lofts, FNP   benzonatate (TESSALON) 200 MG capsule Take 1 capsule (200 mg total) by mouth 3 (three) times daily as needed for up to 7 days for cough. 40 capsule Eliezer Lofts, FNP      PDMP not reviewed this encounter.   Eliezer Lofts, [place] 12/25/21 [place]ly [place]

## 2021-12-25 NOTE — Discharge Instructions (Addendum)
Advised patient to take medication as directed with food to completion.  Advised patient to take Allegra with first dose of Augmentin for the next 5 of 7 days.  Advised may use Allegra as needed afterwards for concurrent postnasal drainage/drip.  Advised may use Tessalon Perles daily or as needed for cough.  Encouraged patient to increase daily water intake while taking these medications.

## 2023-03-17 ENCOUNTER — Encounter: Payer: Self-pay | Admitting: Emergency Medicine

## 2023-03-17 ENCOUNTER — Emergency Department: Payer: BC Managed Care – PPO

## 2023-03-17 ENCOUNTER — Emergency Department
Admission: EM | Admit: 2023-03-17 | Discharge: 2023-03-17 | Disposition: A | Discharge status: Home / Self Care | Payer: BC Managed Care – PPO | Attending: Emergency Medicine | Admitting: Emergency Medicine

## 2023-03-17 ENCOUNTER — Other Ambulatory Visit: Payer: Self-pay

## 2023-03-17 DIAGNOSIS — G8911 Acute pain due to trauma: Secondary | ICD-10-CM

## 2023-03-17 DIAGNOSIS — S4992XA Unspecified injury of left shoulder and upper arm, initial encounter: Secondary | ICD-10-CM | POA: Insufficient documentation

## 2023-03-17 MED ORDER — HYDROCODONE-ACETAMINOPHEN 5-325 MG PO TABS: 1.0000 | ORAL_TABLET | Freq: Four times a day (QID) | ORAL | 0 refills | Status: AC | PRN

## 2023-03-17 MED ORDER — HYDROCODONE-ACETAMINOPHEN 5-325 MG PO TABS
1.0000 | ORAL_TABLET | Freq: Once | ORAL | Status: AC
  Administered 2023-03-17: 1 via ORAL
  Filled 2023-03-17: qty 1

## 2023-03-17 NOTE — Discharge Instructions (Signed)
Please call and schedule a follow up with orthopedics.  Return to the ER for symptoms that change or worsen if unable to schedule an appointment.

## 2023-03-17 NOTE — ED Triage Notes (Signed)
Patient to ED via [facility] from home for left shoulder dislocation after fall off hover board. Patient's shoulder relocated with EMS. Given 100 mcg of fentanyl by EMS.  20 R forearm

## 2023-03-17 NOTE — ED Notes (Signed)
Michael Boroughs, NP notified of BP of 190/130. Provider to bedside and educated patient regarding hypertension and signs and symptoms to come to ED. Patient reported he has not yet had his BP medication and that he will take it when he gets home.

## 2023-03-21 NOTE — ED Provider Notes (Signed)
[facility] Provider Note    Event Date/Time   First MD Initiated Contact with Patient 03/17/23 1854     (approximate)   History   Shoulder Injury   HPI  Michael Arnold is a 37 y.o. male  with no significant past medical history and as listed in EMR presents to the emergency department for evaluation of left shoulder pain. He was riding a hover board and fell off. Shoulder was deformed. EMS arrived and while he was trying to move shoulder, it went back in. Continues to be very painful. No history of dislocation.      Physical Exam   Triage Vital Signs: ED Triage Vitals  Enc Vitals Group     BP 03/17/23 1805 (!) 196/126     Pulse Rate 03/17/23 1805 96     Resp 03/17/23 1805 18     Temp 03/17/23 1805 98.5 F (36.9 C)     Temp Source 03/17/23 1805 Oral     SpO2 03/17/23 1805 100 %     Weight --      Height --      Head Circumference --      Peak Flow --      Pain Score 03/17/23 1804 3     Pain Loc --      Pain Edu? --      Excl. in GC? --     Most recent vital signs: Vitals:   03/17/23 1805 03/17/23 2001  BP: (!) 196/126 (!) 190/130  Pulse: 96 81  Resp: 18 16  Temp: 98.5 F (36.9 C)   SpO2: 100% 100%    General: Awake, no distress.  CV:  Good peripheral perfusion.  Resp:  Normal effort.  Abd:  No distention.  Other:  No step off deformity of left shoulder noted.    ED Results / Procedures / Treatments   Labs (all labs ordered are listed, but only abnormal results are displayed) Labs Reviewed - No data to display   EKG  Not indicated   RADIOLOGY  Image and radiology report reviewed and interpreted by me. Radiology report consistent with the same.  Normal glenohumeral alignment. Possible Hill-Sachs deformity and  nondisplaced anterior inferior glenoid fracture.    PROCEDURES:  Critical Care performed: No  Procedures   MEDICATIONS ORDERED IN ED:  Medications  HYDROcodone-acetaminophen (NORCO/VICODIN) 5-325 MG  per tablet 1 tablet (1 tablet Oral Given 03/17/23 2003)     IMPRESSION / MDM / ASSESSMENT AND PLAN / ED COURSE   I have reviewed the triage note.  Differential diagnosis includes, but is not limited to, shoulder dislocation, humerus fracture,   Patient's presentation is most consistent with acute complicated illness / injury requiring diagnostic workup.  37 year old male presenting to the emergency department for treatment and evaluation after falling off a hover board and injuring his left shoulder.  See HPI for further details.  Imaging shows a possible Hill-Sachs deformity and nondisplaced anterior, inferior glenoid fracture.  Patient placed in a shoulder immobilizer and will be prescribed hydrocodone.  He is to call orthopedics on Monday to request a follow-up appointment.  He was strongly advised not to attempt to raise his arm above his head or use the left arm to lift.   Prior to discharge it was noted that he is hypertensive.  Patient states that he has not taken his medications today.  He denies chest pain, shortness of breath, headache, blurred vision or other symptoms of concern.  He feels  that the elevation in his blood pressure is likely related to situation and pain.  ER return precautions were discussed.  He he was advised to take his medications when he gets home and follow up with primary care when possible.      FINAL CLINICAL IMPRESSION(S) / ED DIAGNOSES   Final diagnoses:  Acute pain of left shoulder due to trauma     Rx / DC Orders   ED Discharge Orders          Ordered    HYDROcodone-acetaminophen (NORCO/VICODIN) 5-325 MG tablet  Every 6 hours PRN        03/17/23 1914             Note:  This document was prepared using Dragon voice recognition software and may include unintentional dictation errors.   Chinita Pester, FNP 03/21/23 1951    Chesley Noon, MD 03/22/23 2009

## 2024-10-08 ENCOUNTER — Emergency Department: Payer: Self-pay

## 2024-10-08 ENCOUNTER — Inpatient Hospital Stay
Admission: EM | Admit: 2024-10-08 | Discharge: 2024-10-10 | DRG: 305 | Disposition: A | Payer: Self-pay | Attending: Internal Medicine | Admitting: Internal Medicine

## 2024-10-08 ENCOUNTER — Other Ambulatory Visit: Payer: Self-pay

## 2024-10-08 DIAGNOSIS — I161 Hypertensive emergency: Secondary | ICD-10-CM | POA: Diagnosis present

## 2024-10-08 DIAGNOSIS — K219 Gastro-esophageal reflux disease without esophagitis: Secondary | ICD-10-CM | POA: Insufficient documentation

## 2024-10-08 DIAGNOSIS — R319 Hematuria, unspecified: Secondary | ICD-10-CM

## 2024-10-08 DIAGNOSIS — I10 Essential (primary) hypertension: Secondary | ICD-10-CM

## 2024-10-08 DIAGNOSIS — R310 Gross hematuria: Secondary | ICD-10-CM

## 2024-10-08 DIAGNOSIS — R10A2 Flank pain, left side: Principal | ICD-10-CM

## 2024-10-08 DIAGNOSIS — I160 Hypertensive urgency: Secondary | ICD-10-CM | POA: Diagnosis present

## 2024-10-08 DIAGNOSIS — J189 Pneumonia, unspecified organism: Secondary | ICD-10-CM

## 2024-10-08 DIAGNOSIS — N179 Acute kidney failure, unspecified: Secondary | ICD-10-CM

## 2024-10-08 LAB — CBC
HCT: 43 % (ref 39.0–52.0)
Hemoglobin: 14.6 g/dL (ref 13.0–17.0)
MCH: 28.8 pg (ref 26.0–34.0)
MCHC: 34 g/dL (ref 30.0–36.0)
MCV: 84.8 fL (ref 80.0–100.0)
Platelets: 241 K/uL (ref 150–400)
RBC: 5.07 MIL/uL (ref 4.22–5.81)
RDW: 13.2 % (ref 11.5–15.5)
WBC: 6.5 K/uL (ref 4.0–10.5)
nRBC: 0 % (ref 0.0–0.2)

## 2024-10-08 LAB — URINALYSIS, ROUTINE W REFLEX MICROSCOPIC
Bacteria, UA: NONE SEEN
Bilirubin Urine: NEGATIVE
Glucose, UA: NEGATIVE mg/dL
Ketones, ur: NEGATIVE mg/dL
Leukocytes,Ua: NEGATIVE
Nitrite: NEGATIVE
Protein, ur: 30 mg/dL — AB
Specific Gravity, Urine: 1.016 (ref 1.005–1.030)
pH: 5 (ref 5.0–8.0)

## 2024-10-08 LAB — COMPREHENSIVE METABOLIC PANEL WITH GFR
ALT: 15 U/L (ref 0–44)
AST: 25 U/L (ref 15–41)
Albumin: 4.4 g/dL (ref 3.5–5.0)
Alkaline Phosphatase: 78 U/L (ref 38–126)
Anion gap: 11 (ref 5–15)
BUN: 24 mg/dL — ABNORMAL HIGH (ref 6–20)
CO2: 29 mmol/L (ref 22–32)
Calcium: 9.7 mg/dL (ref 8.9–10.3)
Chloride: 104 mmol/L (ref 98–111)
Creatinine, Ser: 2.01 mg/dL — ABNORMAL HIGH (ref 0.61–1.24)
GFR, Estimated: 43 mL/min — ABNORMAL LOW (ref >60)
Glucose, Bld: 87 mg/dL (ref 70–99)
Potassium: 3.6 mmol/L (ref 3.5–5.1)
Sodium: 143 mmol/L (ref 135–145)
Total Bilirubin: 0.2 mg/dL (ref 0.0–1.2)
Total Protein: 7.4 g/dL (ref 6.5–8.1)

## 2024-10-08 MED ORDER — PANTOPRAZOLE SODIUM 40 MG PO TBEC
40.0000 mg | DELAYED_RELEASE_TABLET | Freq: Every morning | ORAL | Status: DC
  Administered 2024-10-09 – 2024-10-10 (×2): 40 mg via ORAL
  Filled 2024-10-08 (×2): qty 1

## 2024-10-08 MED ORDER — SODIUM CHLORIDE 0.9 % IV SOLN
500.0000 mg | INTRAVENOUS | Status: DC
  Administered 2024-10-09: 500 mg via INTRAVENOUS
  Filled 2024-10-08: qty 5

## 2024-10-08 MED ORDER — SODIUM CHLORIDE 0.9 % IV SOLN
1.0000 g | Freq: Once | INTRAVENOUS | Status: AC
  Administered 2024-10-08: 1 g via INTRAVENOUS
  Filled 2024-10-08: qty 10

## 2024-10-08 MED ORDER — ONDANSETRON HCL 4 MG/2ML IJ SOLN: 4.0000 mg | Freq: Four times a day (QID) | INTRAMUSCULAR | Status: DC | PRN

## 2024-10-08 MED ORDER — IOHEXOL 300 MG/ML  SOLN
80.0000 mL | Freq: Once | INTRAMUSCULAR | Status: AC | PRN
  Administered 2024-10-08: 80 mL via INTRAVENOUS

## 2024-10-08 MED ORDER — ENOXAPARIN SODIUM 40 MG/0.4ML IJ SOSY: 40.0000 mg | PREFILLED_SYRINGE | INTRAMUSCULAR | Status: DC

## 2024-10-08 MED ORDER — SODIUM CHLORIDE 0.9 % IV SOLN
INTRAVENOUS | Status: AC
  Administered 2024-10-09: 100 mL/h via INTRAVENOUS

## 2024-10-08 MED ORDER — SODIUM CHLORIDE 0.9 % IV SOLN
2.0000 g | INTRAVENOUS | Status: DC
  Administered 2024-10-09: 2 g via INTRAVENOUS
  Filled 2024-10-08: qty 20

## 2024-10-08 MED ORDER — CARVEDILOL 6.25 MG PO TABS
12.5000 mg | ORAL_TABLET | Freq: Two times a day (BID) | ORAL | Status: DC
  Administered 2024-10-09: 12.5 mg via ORAL
  Filled 2024-10-08: qty 2

## 2024-10-08 MED ORDER — SODIUM CHLORIDE 0.9 % IV BOLUS
1000.0000 mL | Freq: Once | INTRAVENOUS | Status: AC
  Administered 2024-10-08: 1000 mL via INTRAVENOUS

## 2024-10-08 MED ORDER — ACETAMINOPHEN 325 MG PO TABS: 650.0000 mg | ORAL_TABLET | Freq: Four times a day (QID) | ORAL | Status: DC | PRN

## 2024-10-08 MED ORDER — ACETAMINOPHEN 650 MG RE SUPP: 650.0000 mg | Freq: Four times a day (QID) | RECTAL | Status: DC | PRN

## 2024-10-08 MED ORDER — HYDROCHLOROTHIAZIDE 25 MG PO TABS: 25.0000 mg | ORAL_TABLET | Freq: Every morning | ORAL | Status: DC

## 2024-10-08 MED ORDER — TRAZODONE HCL 50 MG PO TABS: 25.0000 mg | ORAL_TABLET | Freq: Every evening | ORAL | Status: DC | PRN

## 2024-10-08 MED ORDER — MAGNESIUM HYDROXIDE 400 MG/5ML PO SUSP: 30.0000 mL | Freq: Every day | ORAL | Status: DC | PRN

## 2024-10-08 MED ORDER — ONDANSETRON HCL 4 MG PO TABS: 4.0000 mg | ORAL_TABLET | Freq: Four times a day (QID) | ORAL | Status: DC | PRN

## 2024-10-08 MED ORDER — HYDRALAZINE HCL 20 MG/ML IJ SOLN
5.0000 mg | Freq: Once | INTRAMUSCULAR | Status: AC
  Administered 2024-10-08: 5 mg via INTRAVENOUS
  Filled 2024-10-08: qty 1

## 2024-10-08 MED ORDER — AMLODIPINE BESYLATE 5 MG PO TABS
10.0000 mg | ORAL_TABLET | Freq: Every day | ORAL | Status: DC
  Administered 2024-10-09 – 2024-10-10 (×2): 10 mg via ORAL
  Filled 2024-10-08: qty 1
  Filled 2024-10-08: qty 2

## 2024-10-08 MED ORDER — ACETAMINOPHEN 500 MG PO TABS
1000.0000 mg | ORAL_TABLET | Freq: Once | ORAL | Status: AC
  Administered 2024-10-08: 1000 mg via ORAL
  Filled 2024-10-08: qty 2

## 2024-10-08 NOTE — ED Provider Notes (Signed)
 [facility] Provider Note    Event Date/Time   First MD Initiated Contact with Patient 10/08/24 2032     (approximate)   History   Flank Pain   HPI  Michael Arnold is a 38 y.o. male who presents to the emergency department today because concerns for left flank pain and hematuria.  Patient first noticed some hematuria a few days ago.  He then started having pain to his left hand.  He thinks that he did pass a kidney stone.  He has never had a kidney stone but there is family history of kidney stones.  Since then his urine had cleared up although he did notice some blood in ejaculate. He then noticed bloody urine again today and started developing left flank pain. Does have history of hypertension, and has blood pressure medication although has not taken in regularly over the past week.     Physical Exam   Triage Vital Signs: ED Triage Vitals  Encounter Vitals Group     BP 10/08/24 1914 (!) 205/146     Girls Systolic BP Percentile --      Girls Diastolic BP Percentile --      Boys Systolic BP Percentile --      Boys Diastolic BP Percentile --      Pulse Rate 10/08/24 1914 84     Resp 10/08/24 1914 16     Temp 10/08/24 1914 97.8 F (36.6 C)     Temp Source 10/08/24 1914 Oral     SpO2 10/08/24 1914 96 %     Weight --      Height --      Head Circumference --      Peak Flow --      Pain Score 10/08/24 1912 5     Pain Loc --      Pain Education --      Exclude from Growth Chart --     Most recent vital signs: Vitals:   10/08/24 1914  BP: (!) 205/146  Pulse: 84  Resp: 16  Temp: 97.8 F (36.6 C)  SpO2: 96%   General: Awake, alert, oriented. CV:  Good peripheral perfusion. Regular rate and rhythm. Resp:  Normal effort. Lungs clear. Abd:  No distention. Non tender. No CVA tenderness.   ED Results / Procedures / Treatments   Labs (all labs ordered are listed, but only abnormal results are displayed) Labs Reviewed  URINALYSIS, ROUTINE W  REFLEX MICROSCOPIC - Abnormal; Notable for the following components:      Result Value   Color, Urine YELLOW (*)    APPearance CLEAR (*)    Hgb urine dipstick LARGE (*)    Protein, ur 30 (*)    All other components within normal limits  COMPREHENSIVE METABOLIC PANEL WITH GFR - Abnormal; Notable for the following components:   BUN 24 (*)    Creatinine, Ser 2.01 (*)    GFR, Estimated 43 (*)    All other components within normal limits  CBC     EKG  None   RADIOLOGY I independently interpreted and visualized the CT renal stone study. My interpretation: No kidney stone Radiology interpretation:  IMPRESSION:  1. No acute abnormality in the abdomen or pelvis.  2. Patchy centrilobular ground glass opacities in the left greater than right  lower lungs compatible with small airway infection / inflammation.    I independently interpreted and visualized the CXR. My interpretation: Left basilar opacity Radiology interpretation:  IMPRESSION:  1. Left basilar patchy opacities which may reflect aspiration or pneumonia.  Follow up in 6 - 8 weeks after treatment is recommended to ensure resolution.     PROCEDURES:  Critical Care performed: No   MEDICATIONS ORDERED IN ED: Medications - No data to display   IMPRESSION / MDM / ASSESSMENT AND PLAN / ED COURSE  I reviewed the triage vital signs and the nursing notes.                              Differential diagnosis includes, but is not limited to, kidney stone, kidney infarction, AKI  Patient's presentation is most consistent with acute presentation with potential threat to life or bodily function.  Patient presented to the emergency department today because of concerns for left flank pain and hematuria.  On exam patient without any significant abdominal pain or CVA tenderness.  Patient was found to be hypertensive.  Blood work does show creatinine of 2.  CT without contrast was ordered from triage.  Does not show any kidney  stone.  However I did have concern for possible infarction given patient's pain.  Did obtain CT with contrast which did not show any concerning abnormality.  Given elevated blood pressure in kidney injury the patient would benefit from admission for blood pressure control and fluids.  Additionally imaging showed findings concerning for possible pneumonia.  Patient is essentially asymptomatic for pneumonia.  Will however give antibiotics.     FINAL CLINICAL IMPRESSION(S) / ED DIAGNOSES   Final diagnoses:  Left flank pain  Hypertension, unspecified type  AKI (acute kidney injury)  Pneumonia of left lower lobe due to infectious organism      Note:  This document was prepared using Dragon voice recognition software and may include unintentional dictation errors.    Floy Roberts, MD 10/08/24 [phone]

## 2024-10-08 NOTE — H&P (Addendum)
 [place]   PATIENT NAME: Michael Arnold    MR#:  [id number]  DATE OF BIRTH:  1986-07-19  DATE OF ADMISSION:  10/08/2024  PRIMARY CARE PHYSICIAN: [facility]   Patient is coming from: Home  REQUESTING/REFERRING PHYSICIAN: Floy Roberts, MD  CHIEF COMPLAINT:   Chief Complaint  Patient presents with   Flank Pain    HISTORY OF PRESENT ILLNESS:  Michael Arnold is a 38 y.o. African-American male with medical history significant for essential hypertension, who presented to the emergency room with acute onset of left flank pain and hematuria.  His hematuria started last Tuesday after which he started having left flank pain.  He thought he did pass a kidney stone that looked like gravel.  He never had a history of urolithiasis however he admits to family history.  His urine cleared in the urine when he noticed blood with ejaculation.  He later had hematuria again with left flank pain.  He has not taken his blood pressure medications regularly over the last week because his brother died last week and he stated that he had other things on his mind.  No fever or chills.  No nausea or vomiting or abdominal pain.  No chest pain or palpitations.  He admitted to occasional cough that is mainly dry without wheezing.  He had dyspnea with pain.  No dysuria, oliguria or hematuria or flank pain.  No melena or bright red bleeding per rectum.  No other bleeding diathesis.  ED Course: When the patient came to the ER, BP was 205/146 with otherwise normal vital signs.  Labs revealed BUN of 24 and creatinine 2.0 with otherwise unremarkable CMP.  CBC was normal.  UA showed 11-20 RBCs with 30 protein and only 0-5 WBCs. EKG as reviewed by me : None. Imaging: CT renal stone revealed the following: 1. No acute abnormality in the abdomen or pelvis. 2. Patchy centrilobular ground glass opacities in the left greater than right lower lungs compatible with small airway  infection / inflammation.  Abdominal and pelvic CT with contrast revealed the following: 1. No acute localizing process in the abdomen or pelvis. 2. Right renal atrophy and scarring. 3. Small hiatal hernia. 4. Stable patchy airspace and ground-glass opacities in both lung bases, left greater than right, compatible with infection.  2 view chest x-ray showed the following: 1. Left basilar patchy opacities which may reflect aspiration or pneumonia. Follow up in 6 - 8 weeks after treatment is recommended to ensure resolution.  The patient was given 1 g of IV Rocephin , 5 mg of IV hydralazine, 1 L bolus of IV normal saline and 1 g of p.o. Tylenol .  He will be admitted to a progressive unit bed for further evaluation and management. PAST MEDICAL HISTORY:   Past Medical History:  Diagnosis Date   Hypertension     PAST SURGICAL HISTORY:   Past Surgical History:  Procedure Laterality Date   KNEE SURGERY      SOCIAL HISTORY:   Social History   Tobacco Use   Smoking status: Never   Smokeless tobacco: Not on file  Substance Use Topics   Alcohol use: No    FAMILY HISTORY:  Positive for hypertension, diabetes mellitus, cancer, CVA, MI and urolithiasis.  DRUG ALLERGIES:  No Known Allergies  REVIEW OF SYSTEMS:   ROS As per history of present illness. All pertinent systems were reviewed above. Constitutional, HEENT, cardiovascular, respiratory, GI, GU, musculoskeletal, neuro, psychiatric, endocrine, integumentary  and hematologic systems were reviewed and are otherwise negative/unremarkable except for positive findings mentioned above in the HPI.   MEDICATIONS AT HOME:   Prior to Admission medications   Medication Sig Start Date End Date Taking? Authorizing Provider  amLODipine (NORVASC) 10 MG tablet Take 10 mg by mouth daily. 12/12/21  Yes [provider]  carvedilol (COREG) 12.5 MG tablet Take 12.5 mg by mouth 2 (two) times daily. 08/17/23  Yes [provider]   hydrochlorothiazide (HYDRODIURIL) 25 MG tablet Take 25 mg by mouth every morning. 12/12/21  Yes [provider]  metoprolol succinate (TOPROL-XL) 25 MG 24 hr tablet Take 25 mg by mouth daily. 12/12/21  Yes [provider]  pantoprazole (PROTONIX) 40 MG tablet Take 40 mg by mouth every morning. 08/17/23  Yes [provider]      VITAL SIGNS:  Blood pressure (!) 189/139, pulse 84, temperature 97.8 F (36.6 C), temperature source Oral, resp. rate 16, SpO2 98%.  PHYSICAL EXAMINATION:  Physical Exam  GENERAL:  38 y.o.-year-old patient lying in the bed with no acute distress.  EYES: Pupils equal, round, reactive to light and accommodation. No scleral icterus. Extraocular muscles intact.  HEENT: Head atraumatic, normocephalic. Oropharynx and nasopharynx clear.  NECK:  Supple, no jugular venous distention. No thyroid enlargement, no tenderness.  LUNGS: Diminished bibasilar breath sounds with bibasal crackles. No use of accessory muscles of respiration.  CARDIOVASCULAR: Regular rate and rhythm, S1, S2 normal. No murmurs, rubs, or gallops.  ABDOMEN: Soft, nondistended, nontender. Bowel sounds present. No organomegaly or mass.  EXTREMITIES: No pedal edema, cyanosis, or clubbing.  NEUROLOGIC: Cranial nerves II through XII are intact. Muscle strength 5/5 in all extremities. Sensation intact. Gait not checked.  PSYCHIATRIC: The patient is alert and oriented x 3.  Normal affect and good eye contact. SKIN: No obvious rash, lesion, or ulcer.   LABORATORY PANEL:   CBC Recent Labs  Lab 10/08/24 1916  WBC 6.5  HGB 14.6  HCT 43.0  PLT 241   ------------------------------------------------------------------------------------------------------------------  Chemistries  Recent Labs  Lab 10/08/24 1916  NA 143  K 3.6  CL 104  CO2 29  GLUCOSE 87  BUN 24*  CREATININE 2.01*  CALCIUM 9.7  AST 25  ALT 15  ALKPHOS 78  BILITOT 0.2    ------------------------------------------------------------------------------------------------------------------  Cardiac Enzymes No results for input(s): TROPONINI in the last 168 hours. ------------------------------------------------------------------------------------------------------------------  RADIOLOGY:  CT ABDOMEN PELVIS W CONTRAST Result Date: 10/08/2024 CLINICAL DATA:  Left flank pain EXAM: CT ABDOMEN AND PELVIS WITH CONTRAST TECHNIQUE: Multidetector CT imaging of the abdomen and pelvis was performed using the standard protocol following bolus administration of intravenous contrast. RADIATION DOSE REDUCTION: This exam was performed according to the departmental dose-optimization program which includes automated exposure control, adjustment of the mA and/or kV according to patient size and/or use of iterative reconstruction technique. CONTRAST:  80mL OMNIPAQUE IOHEXOL 300 MG/ML  SOLN COMPARISON:  CT renal stone 10/08/2024. FINDINGS: Lower chest: Patchy airspace and ground-glass opacities are seen in both lung bases, left greater than right. This is unchanged. Hepatobiliary: No focal liver abnormality is seen. No gallstones, gallbladder wall thickening, or biliary dilatation. Pancreas: Unremarkable. No pancreatic ductal dilatation or surrounding inflammatory changes. Spleen: Normal in size without focal abnormality. Adrenals/Urinary Tract: The left kidney, adrenal glands and bladder appear within normal limits. There is right renal atrophy and scarring. Stomach/Bowel: There is a small hiatal hernia. Stomach is within normal limits. Appendix appears normal. No evidence of bowel wall thickening, distention, or inflammatory  changes. Vascular/Lymphatic: No significant vascular findings are present. No enlarged abdominal or pelvic lymph nodes. Reproductive: Prostate is unremarkable. Other: No abdominal wall hernia or abnormality. No abdominopelvic ascites. Musculoskeletal: No acute or  significant osseous findings. IMPRESSION: 1. No acute localizing process in the abdomen or pelvis. 2. Right renal atrophy and scarring. 3. Small hiatal hernia. 4. Stable patchy airspace and ground-glass opacities in both lung bases, left greater than right, compatible with infection. Electronically Signed   By: Greig Pique M.D.   On: 10/08/2024 22:56   DG Chest 2 View Result Date: 10/08/2024 EXAM: 2 VIEW(S) XRAY OF THE CHEST 10/08/2024 08:42:00 PM COMPARISON: 6 / 20 / 19. CLINICAL HISTORY: infiltrates? Flank  pain FINDINGS: LUNGS AND PLEURA: Left basilar patchy opacities. No pleural effusion. No pneumothorax. HEART AND MEDIASTINUM: No acute abnormality of the cardiac and mediastinal silhouettes. BONES AND SOFT TISSUES: No acute osseous abnormality. IMPRESSION: 1. Left basilar patchy opacities which may reflect aspiration or pneumonia. Follow up in 6 - 8 weeks after treatment is recommended to ensure resolution. Electronically signed by: Norman Gatlin MD 10/08/2024 08:51 PM EST RP Workstation: [id number]   CT Renal Stone Study Result Date: 10/08/2024 EXAM: CT UROGRAM 10/08/2024 07:54:48 PM TECHNIQUE: CT of the abdomen and pelvis was performed without the administration of intravenous contrast as per CT urogram protocol. Automated exposure control, iterative reconstruction, and/or weight based adjustment of the mA/kV was utilized to reduce the radiation dose to as low as reasonably achievable. COMPARISON: None available. CLINICAL HISTORY: Abdominal/flank pain, stone suspected. FINDINGS: LOWER CHEST: Patchy centrilobular ground glass opacities in the left greater than right lower lungs. LIVER: The liver is unremarkable. GALLBLADDER AND BILE DUCTS: Gallbladder is unremarkable. No biliary ductal dilatation. SPLEEN: No acute abnormality. PANCREAS: No acute abnormality. ADRENAL GLANDS: No acute abnormality. KIDNEYS, URETERS AND BLADDER: No stones in the kidneys or ureters. No hydronephrosis. No perinephric or  periureteral stranding. Urinary bladder is unremarkable. GI AND BOWEL: Stomach demonstrates no acute abnormality. There is no bowel obstruction. Normal appendix. PERITONEUM AND RETROPERITONEUM: No ascites. No free air. VASCULATURE: Aorta is normal in caliber. LYMPH NODES: No lymphadenopathy. REPRODUCTIVE ORGANS: No acute abnormality. BONES AND SOFT TISSUES: No acute osseous abnormality. No focal soft tissue abnormality. IMPRESSION: 1. No acute abnormality in the abdomen or pelvis. 2. Patchy centrilobular ground glass opacities in the left greater than right lower lungs compatible with small airway infection / inflammation. Electronically signed by: Norman Gatlin MD 10/08/2024 08:02 PM EST RP Workstation: [id number]      IMPRESSION AND PLAN:  Assessment and Plan: * Hypertensive urgency - The patient was admitted to a progressive unit bed. - Will continue antihypertensive therapy. - We will place him on as needed IV hydralazine and labetalol. - This could be contributing to his hematuria.   Hematuria - CT renal stone showed no evidence for stones. - Urology consult will be obtained. - I notified Dr. Carolee about the patient.  CAP (community acquired pneumonia) - Will continue antibiotic therapy with IV Rocephin  and Zithromax. - Mucolytic therapy be provided as well as duo nebs q.i.d. and q.4 hours p.r.n. - We will follow blood cultures.   AKI (acute kidney injury) - Will continue hydration with IV normal saline and follow BMP. - Will avoid nephrotoxins.  GERD without esophagitis - Continue PPI therapy   DVT prophylaxis: SCDs. Advanced Care Planning:  Code Status: full code.  Family Communication:  The plan of care was discussed in details with the patient (and family). I answered all questions.  The patient agreed to proceed with the above mentioned plan. Further management will depend upon hospital course. Disposition Plan: Back to previous home environment Consults called:  Urology. All the records are reviewed and case discussed with ED provider.  Status is: Inpatient  At the time of the admission, it appears that the appropriate admission status for this patient is inpatient.  This is judged to be reasonable and necessary in order to provide the required intensity of service to ensure the patient's safety given the presenting symptoms, physical exam findings and initial radiographic and laboratory data in the context of comorbid conditions.  The patient requires inpatient status due to high intensity of service, high risk of further deterioration and high frequency of surveillance required.  I certify that at the time of admission, it is my clinical judgment that the patient will require inpatient hospital care extending more than 2 midnights.                            Dispo: The patient is from: Home              Anticipated d/c is to: Home              Patient currently is not medically stable to d/c.              Difficult to place patient: No  Madison DELENA Peaches M.D on 10/09/2024 at 12:52 AM  [facility]   From 7 PM-7 AM, contact night-coverage [email or web address]  CC: Primary care physician; [facility]

## 2024-10-08 NOTE — H&P (Incomplete)
 [place]   PATIENT NAME: Michael Arnold    MR#:  [id number]  DATE OF BIRTH:  02/01/1986  DATE OF ADMISSION:  10/08/2024  PRIMARY CARE PHYSICIAN: [facility]   Patient is coming from: Home  REQUESTING/REFERRING PHYSICIAN: Floy Linen, MD  CHIEF COMPLAINT:   Chief Complaint  Patient presents with  . Flank Pain    HISTORY OF PRESENT ILLNESS:  Michael Arnold is a 38 y.o. male with medical history significant for ***  ED Course: *** EKG as reviewed by me : *** Imaging: *** PAST MEDICAL HISTORY:   Past Medical History:  Diagnosis Date  . Hypertension     PAST SURGICAL HISTORY:   Past Surgical History:  Procedure Laterality Date  . KNEE SURGERY      SOCIAL HISTORY:   Social History   Tobacco Use  . Smoking status: Never  . Smokeless tobacco: Not on file  Substance Use Topics  . Alcohol use: No    FAMILY HISTORY:  No family history on file.  DRUG ALLERGIES:  No Known Allergies  REVIEW OF SYSTEMS:   ROS As per history of present illness. All pertinent systems were reviewed above. Constitutional, HEENT, cardiovascular, respiratory, GI, GU, musculoskeletal, neuro, psychiatric, endocrine, integumentary and hematologic systems were reviewed and are otherwise negative/unremarkable except for positive findings mentioned above in the HPI.   MEDICATIONS AT HOME:   Prior to Admission medications   Medication Sig Start Date End Date Taking? Authorizing Provider  amLODipine (NORVASC) 10 MG tablet Take 10 mg by mouth daily. 12/12/21  Yes [provider]  carvedilol (COREG) 12.5 MG tablet Take 12.5 mg by mouth 2 (two) times daily. 08/17/23  Yes [provider]  hydrochlorothiazide (HYDRODIURIL) 25 MG tablet Take 25 mg by mouth every morning. 12/12/21  Yes [provider]  metoprolol succinate (TOPROL-XL) 25 MG 24 hr tablet Take 25 mg by mouth daily. 12/12/21  Yes [provider]   pantoprazole (PROTONIX) 40 MG tablet Take 40 mg by mouth every morning. 08/17/23  Yes [provider]      VITAL SIGNS:  Blood pressure (!) 189/139, pulse 84, temperature 97.8 F (36.6 C), temperature source Oral, resp. rate 16, SpO2 98%.  PHYSICAL EXAMINATION:  Physical Exam  GENERAL:  38 y.o.-year-old patient lying in the bed with no acute distress.  EYES: Pupils equal, round, reactive to light and accommodation. No scleral icterus. Extraocular muscles intact.  HEENT: Head atraumatic, normocephalic. Oropharynx and nasopharynx clear.  NECK:  Supple, no jugular venous distention. No thyroid enlargement, no tenderness.  LUNGS: Normal breath sounds bilaterally, no wheezing, rales,rhonchi or crepitation. No use of accessory muscles of respiration.  CARDIOVASCULAR: Regular rate and rhythm, S1, S2 normal. No murmurs, rubs, or gallops.  ABDOMEN: Soft, nondistended, nontender. Bowel sounds present. No organomegaly or mass.  EXTREMITIES: No pedal edema, cyanosis, or clubbing.  NEUROLOGIC: Cranial nerves II through XII are intact. Muscle strength 5/5 in all extremities. Sensation intact. Gait not checked.  PSYCHIATRIC: The patient is alert and oriented x 3.  Normal affect and good eye contact. SKIN: No obvious rash, lesion, or ulcer.   LABORATORY PANEL:   CBC Recent Labs  Lab 10/08/24 1916  WBC 6.5  HGB 14.6  HCT 43.0  PLT 241   ------------------------------------------------------------------------------------------------------------------  Chemistries  Recent Labs  Lab 10/08/24 1916  NA 143  K 3.6  CL 104  CO2 29  GLUCOSE 87  BUN 24*  CREATININE 2.01*  CALCIUM  9.7  AST 25  ALT 15  ALKPHOS 78  BILITOT 0.2   ------------------------------------------------------------------------------------------------------------------  Cardiac Enzymes No results for input(s): TROPONINI in the last 168  hours. ------------------------------------------------------------------------------------------------------------------  RADIOLOGY:  CT ABDOMEN PELVIS W CONTRAST Result Date: 10/08/2024 CLINICAL DATA:  Left flank pain EXAM: CT ABDOMEN AND PELVIS WITH CONTRAST TECHNIQUE: Multidetector CT imaging of the abdomen and pelvis was performed using the standard protocol following bolus administration of intravenous contrast. RADIATION DOSE REDUCTION: This exam was performed according to the departmental dose-optimization program which includes automated exposure control, adjustment of the mA and/or kV according to patient size and/or use of iterative reconstruction technique. CONTRAST:  80mL OMNIPAQUE IOHEXOL 300 MG/ML  SOLN COMPARISON:  CT renal stone 10/08/2024. FINDINGS: Lower chest: Patchy airspace and ground-glass opacities are seen in both lung bases, left greater than right. This is unchanged. Hepatobiliary: No focal liver abnormality is seen. No gallstones, gallbladder wall thickening, or biliary dilatation. Pancreas: Unremarkable. No pancreatic ductal dilatation or surrounding inflammatory changes. Spleen: Normal in size without focal abnormality. Adrenals/Urinary Tract: The left kidney, adrenal glands and bladder appear within normal limits. There is right renal atrophy and scarring. Stomach/Bowel: There is a small hiatal hernia. Stomach is within normal limits. Appendix appears normal. No evidence of bowel wall thickening, distention, or inflammatory changes. Vascular/Lymphatic: No significant vascular findings are present. No enlarged abdominal or pelvic lymph nodes. Reproductive: Prostate is unremarkable. Other: No abdominal wall hernia or abnormality. No abdominopelvic ascites. Musculoskeletal: No acute or significant osseous findings. IMPRESSION: 1. No acute localizing process in the abdomen or pelvis. 2. Right renal atrophy and scarring. 3. Small hiatal hernia. 4. Stable patchy airspace and  ground-glass opacities in both lung bases, left greater than right, compatible with infection. Electronically Signed   By: Greig Pique M.D.   On: 10/08/2024 22:56   DG Chest 2 View Result Date: 10/08/2024 EXAM: 2 VIEW(S) XRAY OF THE CHEST 10/08/2024 08:42:00 PM COMPARISON: 6 / 20 / 19. CLINICAL HISTORY: infiltrates? Flank  pain FINDINGS: LUNGS AND PLEURA: Left basilar patchy opacities. No pleural effusion. No pneumothorax. HEART AND MEDIASTINUM: No acute abnormality of the cardiac and mediastinal silhouettes. BONES AND SOFT TISSUES: No acute osseous abnormality. IMPRESSION: 1. Left basilar patchy opacities which may reflect aspiration or pneumonia. Follow up in 6 - 8 weeks after treatment is recommended to ensure resolution. Electronically signed by: Norman Gatlin MD 10/08/2024 08:51 PM EST RP Workstation: [id number]   CT Renal Stone Study Result Date: 10/08/2024 EXAM: CT UROGRAM 10/08/2024 07:54:48 PM TECHNIQUE: CT of the abdomen and pelvis was performed without the administration of intravenous contrast as per CT urogram protocol. Automated exposure control, iterative reconstruction, and/or weight based adjustment of the mA/kV was utilized to reduce the radiation dose to as low as reasonably achievable. COMPARISON: None available. CLINICAL HISTORY: Abdominal/flank pain, stone suspected. FINDINGS: LOWER CHEST: Patchy centrilobular ground glass opacities in the left greater than right lower lungs. LIVER: The liver is unremarkable. GALLBLADDER AND BILE DUCTS: Gallbladder is unremarkable. No biliary ductal dilatation. SPLEEN: No acute abnormality. PANCREAS: No acute abnormality. ADRENAL GLANDS: No acute abnormality. KIDNEYS, URETERS AND BLADDER: No stones in the kidneys or ureters. No hydronephrosis. No perinephric or periureteral stranding. Urinary bladder is unremarkable. GI AND BOWEL: Stomach demonstrates no acute abnormality. There is no bowel obstruction. Normal appendix. PERITONEUM AND  RETROPERITONEUM: No ascites. No free air. VASCULATURE: Aorta is normal in caliber. LYMPH NODES: No lymphadenopathy. REPRODUCTIVE ORGANS: No acute abnormality. BONES AND SOFT TISSUES: No acute osseous abnormality. No focal  soft tissue abnormality. IMPRESSION: 1. No acute abnormality in the abdomen or pelvis. 2. Patchy centrilobular ground glass opacities in the left greater than right lower lungs compatible with small airway infection / inflammation. Electronically signed by: Norman Gatlin MD 10/08/2024 08:02 PM EST RP Workstation: [id number]      IMPRESSION AND PLAN:  Assessment and Plan: No notes have been filed under this hospital service. Service: Hospitalist      DVT prophylaxis: Lovenox***  Advanced Care Planning:  Code Status: full code***  Family Communication:  The plan of care was discussed in details with the patient (and family). I answered all questions. The patient agreed to proceed with the above mentioned plan. Further management will depend upon hospital course. Disposition Plan: Back to previous home environment Consults called: none***  All the records are reviewed and case discussed with ED provider.  Status is: Inpatient {Inpatient:23812}   At the time of the admission, it appears that the appropriate admission status for this patient is inpatient.  This is judged to be reasonable and necessary in order to provide the required intensity of service to ensure the patient's safety given the presenting symptoms, physical exam findings and initial radiographic and laboratory data in the context of comorbid conditions.  The patient requires inpatient status due to high intensity of service, high risk of further deterioration and high frequency of surveillance required.  I certify that at the time of admission, it is my clinical judgment that the patient will require inpatient hospital care extending more than 2 midnights.                            Dispo: The patient is  from: Home              Anticipated d/c is to: Home              Patient currently is not medically stable to d/c.              Difficult to place patient: No  Madison DELENA Peaches M.D on 10/08/2024 at 11:58 PM  [facility]   From 7 PM-7 AM, contact night-coverage [email or web address]  CC: Primary care physician; [facility]

## 2024-10-08 NOTE — ED Triage Notes (Signed)
 Pt presents via POV c/o left sided flank pain and hematuria for the last few days.

## 2024-10-08 NOTE — ED Triage Notes (Signed)
 Pt reports has a hx of HTN however non-compliant with medications.

## 2024-10-09 DIAGNOSIS — J189 Pneumonia, unspecified organism: Secondary | ICD-10-CM

## 2024-10-09 DIAGNOSIS — I160 Hypertensive urgency: Secondary | ICD-10-CM | POA: Diagnosis not present

## 2024-10-09 DIAGNOSIS — I161 Hypertensive emergency: Secondary | ICD-10-CM | POA: Diagnosis present

## 2024-10-09 DIAGNOSIS — R319 Hematuria, unspecified: Secondary | ICD-10-CM

## 2024-10-09 DIAGNOSIS — K219 Gastro-esophageal reflux disease without esophagitis: Secondary | ICD-10-CM | POA: Insufficient documentation

## 2024-10-09 DIAGNOSIS — N179 Acute kidney failure, unspecified: Secondary | ICD-10-CM

## 2024-10-09 LAB — BASIC METABOLIC PANEL WITH GFR
Anion gap: 10 (ref 5–15)
BUN: 20 mg/dL (ref 6–20)
CO2: 26 mmol/L (ref 22–32)
Calcium: 8.8 mg/dL — ABNORMAL LOW (ref 8.9–10.3)
Chloride: 105 mmol/L (ref 98–111)
Creatinine, Ser: 1.62 mg/dL — ABNORMAL HIGH (ref 0.61–1.24)
GFR, Estimated: 56 mL/min — ABNORMAL LOW (ref >60)
Glucose, Bld: 103 mg/dL — ABNORMAL HIGH (ref 70–99)
Potassium: 3.4 mmol/L — ABNORMAL LOW (ref 3.5–5.1)
Sodium: 141 mmol/L (ref 135–145)

## 2024-10-09 LAB — CBC
HCT: 43.5 % (ref 39.0–52.0)
Hemoglobin: 14.8 g/dL (ref 13.0–17.0)
MCH: 28.7 pg (ref 26.0–34.0)
MCHC: 34 g/dL (ref 30.0–36.0)
MCV: 84.3 fL (ref 80.0–100.0)
Platelets: 215 K/uL (ref 150–400)
RBC: 5.16 MIL/uL (ref 4.22–5.81)
RDW: 13 % (ref 11.5–15.5)
WBC: 5.9 K/uL (ref 4.0–10.5)
nRBC: 0 % (ref 0.0–0.2)

## 2024-10-09 LAB — PROCALCITONIN: Procalcitonin: 0.11 ng/mL

## 2024-10-09 LAB — HIV ANTIBODY (ROUTINE TESTING W REFLEX): HIV Screen 4th Generation wRfx: NONREACTIVE

## 2024-10-09 MED ORDER — CARVEDILOL 25 MG PO TABS
25.0000 mg | ORAL_TABLET | Freq: Two times a day (BID) | ORAL | Status: DC
  Administered 2024-10-09 – 2024-10-10 (×3): 25 mg via ORAL
  Filled 2024-10-09 (×2): qty 1
  Filled 2024-10-09: qty 4

## 2024-10-09 MED ORDER — HYDRALAZINE HCL 25 MG PO TABS
25.0000 mg | ORAL_TABLET | Freq: Three times a day (TID) | ORAL | Status: DC
  Administered 2024-10-09 – 2024-10-10 (×4): 25 mg via ORAL
  Filled 2024-10-09 (×4): qty 1

## 2024-10-09 MED ORDER — IPRATROPIUM-ALBUTEROL 0.5-2.5 (3) MG/3ML IN SOLN: 3.0000 mL | Freq: Four times a day (QID) | RESPIRATORY_TRACT | Status: DC

## 2024-10-09 MED ORDER — GUAIFENESIN ER 600 MG PO TB12
600.0000 mg | ORAL_TABLET | Freq: Two times a day (BID) | ORAL | Status: DC
  Administered 2024-10-09 (×2): 600 mg via ORAL
  Filled 2024-10-09 (×3): qty 1

## 2024-10-09 MED ORDER — IPRATROPIUM-ALBUTEROL 0.5-2.5 (3) MG/3ML IN SOLN: 3.0000 mL | RESPIRATORY_TRACT | Status: DC | PRN

## 2024-10-09 MED ORDER — LABETALOL HCL 5 MG/ML IV SOLN
10.0000 mg | INTRAVENOUS | Status: DC | PRN
  Administered 2024-10-09 – 2024-10-10 (×2): 10 mg via INTRAVENOUS
  Filled 2024-10-09 (×2): qty 4

## 2024-10-09 MED ORDER — HYDROCOD POLI-CHLORPHE POLI ER 10-8 MG/5ML PO SUER: 5.0000 mL | Freq: Two times a day (BID) | ORAL | Status: DC | PRN

## 2024-10-09 NOTE — Assessment & Plan Note (Signed)
-   CT renal stone showed no evidence for stones. - Urology consult will be obtained. - I notified Dr. Carolee about the patient.

## 2024-10-09 NOTE — Assessment & Plan Note (Addendum)
-   Will continue antibiotic therapy with IV Rocephin and Zithromax. - Mucolytic therapy be provided as well as duo nebs q.i.d. and q.4 hours p.r.n. - We will follow blood cultures.

## 2024-10-09 NOTE — Assessment & Plan Note (Signed)
-   Will continue hydration with IV normal saline and follow BMP. - Will avoid nephrotoxins.

## 2024-10-09 NOTE — Assessment & Plan Note (Signed)
-   The patient was admitted to a progressive unit bed. - Will continue antihypertensive therapy. - We will place him on as needed IV hydralazine and labetalol. - This could be contributing to his hematuria.

## 2024-10-09 NOTE — Progress Notes (Signed)
 PROGRESS NOTE    Michael Arnold  FMW:[id number] DOB: 12-28-1985 DOA: 10/08/2024 PCP: [facility]    Brief Narrative:  38 y.o. African-American male with medical history significant for essential hypertension, who presented to the emergency room with acute onset of left flank pain and hematuria.  His hematuria started last Tuesday after which he started having left flank pain.  He thought he did pass a kidney stone that looked like gravel.  He never had a history of urolithiasis however he admits to family history.  His urine cleared in the urine when he noticed blood with ejaculation.  He later had hematuria again with left flank pain.  He has not taken his blood pressure medications regularly over the last week because his brother died last week and he stated that he had other things on his mind.  No fever or chills.  No nausea or vomiting or abdominal pain.  No chest pain or palpitations.  He admitted to occasional cough that is mainly dry without wheezing.  He had dyspnea with pain.  No dysuria, oliguria or hematuria or flank pain.  No melena or bright red bleeding per rectum.  No other bleeding diathesis.    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  CT with contrast negative for renal infarct.  CT renal stone study did not show any active stones. Plan: Slowly reduce blood pressure with oral agents over the next 24 hours Repeat kidney function in a.m. IV fluids  Gross hematuria Possibly secondary to passed stone.  Urology consulted.  No acute urologic intervention necessary.  Will need outpatient follow-up.  Possible Community-acquired pneumonia Infiltrate  noted on chest x-ray however patient does not present with any infectious symptoms.  Possible that this is incidental finding versus basilar atelectasis Plan: Continue empiric antimicrobial therapy for now Check procalcitonin.  If negative can discontinue antibiotics  GERD PPI   DVT prophylaxis: SCDs Code Status: Full Family Communication: None Disposition Plan: Status is: Inpatient Remains inpatient appropriate because: Hypertensive emergency.  AKI.  Anticipate discharge 12/2   Level of care: Telemetry  Consultants:  Urology  Procedures:  None  Antimicrobials: Rocephin  Azithromycin   Subjective: Seen and examined.  Resting comfortably in bed.  No pain.  No complaints.  Objective: Vitals:   10/09/24 0730 10/09/24 0830 10/09/24 1006 10/09/24 1100  BP: (!) 165/118 (!) 171/122 (!) 160/115   Pulse: 71 77 77   Resp: (!) 25 (!) 31 18   Temp:   98 F (36.7 C) 98.1 F (36.7 C)  TempSrc:   Oral Oral  SpO2: 96% 98% 100%     Intake/Output Summary (Last 24 hours) at 10/09/2024 1145 Last data filed at 10/09/2024 0329 Gross per 24 hour  Intake 1350 ml  Output --  Net 1350 ml   There were no vitals filed for this visit.  Examination:  General exam: Appears calm and comfortable  Respiratory system: Clear to auscultation. Respiratory effort normal. Cardiovascular system: S1-S2, RRR, no murmurs, no pedal edema Gastrointestinal system: Soft, NT/ND, normal bowel sounds Central nervous system: Alert and oriented. No focal neurological deficits. Extremities: Symmetric 5 x 5 power. Skin: No rashes, lesions or ulcers Psychiatry: Judgement and insight appear normal. Mood & affect  appropriate.     Data Reviewed: I have personally reviewed following labs and imaging studies  CBC: Recent Labs  Lab 10/08/24 1916 10/09/24 0409  WBC 6.5 5.9  HGB 14.6 14.8  HCT 43.0 43.5  MCV 84.8 84.3  PLT 241 215   Basic Metabolic Panel: Recent Labs  Lab 10/08/24 1916 10/09/24 0409   NA 143 141  K 3.6 3.4*  CL 104 105  CO2 29 26  GLUCOSE 87 103*  BUN 24* 20  CREATININE 2.01* 1.62*  CALCIUM 9.7 8.8*   GFR: CrCl cannot be calculated (Unknown ideal weight.). Liver Function Tests: Recent Labs  Lab 10/08/24 1916  AST 25  ALT 15  ALKPHOS 78  BILITOT 0.2  PROT 7.4  ALBUMIN 4.4   No results for input(s): LIPASE, AMYLASE in the last 168 hours. No results for input(s): AMMONIA in the last 168 hours. Coagulation Profile: No results for input(s): INR, PROTIME in the last 168 hours. Cardiac Enzymes: No results for input(s): CKTOTAL, CKMB, CKMBINDEX, TROPONINI in the last 168 hours. BNP (last 3 results) No results for input(s): PROBNP in the last 8760 hours. HbA1C: No results for input(s): HGBA1C in the last 72 hours. CBG: No results for input(s): GLUCAP in the last 168 hours. Lipid Profile: No results for input(s): CHOL, HDL, LDLCALC, TRIG, CHOLHDL, LDLDIRECT in the last 72 hours. Thyroid Function Tests: No results for input(s): TSH, T4TOTAL, FREET4, T3FREE, THYROIDAB in the last 72 hours. Anemia Panel: No results for input(s): VITAMINB12, FOLATE, FERRITIN, TIBC, IRON, RETICCTPCT in the last 72 hours. Sepsis Labs: No results for input(s): PROCALCITON, LATICACIDVEN in the last 168 hours.  No results found for this or any previous visit (from the past 240 hours).       Radiology Studies: CT ABDOMEN PELVIS W CONTRAST Result Date: 10/08/2024 CLINICAL DATA:  Left flank pain EXAM: CT ABDOMEN AND PELVIS WITH CONTRAST TECHNIQUE: Multidetector CT imaging of the abdomen and pelvis was performed using the standard protocol following bolus administration of intravenous contrast. RADIATION DOSE REDUCTION: This exam was performed according to the departmental dose-optimization program which includes automated exposure control, adjustment of the mA and/or kV according to patient size and/or use of iterative  reconstruction technique. CONTRAST:  80mL OMNIPAQUE IOHEXOL 300 MG/ML  SOLN COMPARISON:  CT renal stone 10/08/2024. FINDINGS: Lower chest: Patchy airspace and ground-glass opacities are seen in both lung bases, left greater than right. This is unchanged. Hepatobiliary: No focal liver abnormality is seen. No gallstones, gallbladder wall thickening, or biliary dilatation. Pancreas: Unremarkable. No pancreatic ductal dilatation or surrounding inflammatory changes. Spleen: Normal in size without focal abnormality. Adrenals/Urinary Tract: The left kidney, adrenal glands and bladder appear within normal limits. There is right renal atrophy and scarring. Stomach/Bowel: There is a small hiatal hernia. Stomach is within normal limits. Appendix appears normal. No evidence of bowel wall thickening, distention, or inflammatory changes. Vascular/Lymphatic: No significant vascular findings are present. No enlarged abdominal or pelvic lymph nodes. Reproductive: Prostate is unremarkable. Other: No abdominal wall hernia or abnormality. No abdominopelvic ascites. Musculoskeletal: No acute or significant osseous findings. IMPRESSION: 1. No acute localizing process in the abdomen or pelvis. 2. Right renal atrophy and scarring. 3. Small hiatal hernia. 4. Stable patchy airspace and ground-glass opacities in both lung bases, left greater than right, compatible with infection. Electronically Signed   By: Greig Pique M.D.   On: 10/08/2024 22:56   DG Chest 2 View Result Date: 10/08/2024 EXAM: 2 VIEW(S) XRAY OF THE CHEST 10/08/2024 08:42:00 PM  COMPARISON: 6 / 20 / 19. CLINICAL HISTORY: infiltrates? Flank  pain FINDINGS: LUNGS AND PLEURA: Left basilar patchy opacities. No pleural effusion. No pneumothorax. HEART AND MEDIASTINUM: No acute abnormality of the cardiac and mediastinal silhouettes. BONES AND SOFT TISSUES: No acute osseous abnormality. IMPRESSION: 1. Left basilar patchy opacities which may reflect aspiration or pneumonia.  Follow up in 6 - 8 weeks after treatment is recommended to ensure resolution. Electronically signed by: Norman Gatlin MD 10/08/2024 08:51 PM EST RP Workstation: [id number]   CT Renal Stone Study Result Date: 10/08/2024 EXAM: CT UROGRAM 10/08/2024 07:54:48 PM TECHNIQUE: CT of the abdomen and pelvis was performed without the administration of intravenous contrast as per CT urogram protocol. Automated exposure control, iterative reconstruction, and/or weight based adjustment of the mA/kV was utilized to reduce the radiation dose to as low as reasonably achievable. COMPARISON: None available. CLINICAL HISTORY: Abdominal/flank pain, stone suspected. FINDINGS: LOWER CHEST: Patchy centrilobular ground glass opacities in the left greater than right lower lungs. LIVER: The liver is unremarkable. GALLBLADDER AND BILE DUCTS: Gallbladder is unremarkable. No biliary ductal dilatation. SPLEEN: No acute abnormality. PANCREAS: No acute abnormality. ADRENAL GLANDS: No acute abnormality. KIDNEYS, URETERS AND BLADDER: No stones in the kidneys or ureters. No hydronephrosis. No perinephric or periureteral stranding. Urinary bladder is unremarkable. GI AND BOWEL: Stomach demonstrates no acute abnormality. There is no bowel obstruction. Normal appendix. PERITONEUM AND RETROPERITONEUM: No ascites. No free air. VASCULATURE: Aorta is normal in caliber. LYMPH NODES: No lymphadenopathy. REPRODUCTIVE ORGANS: No acute abnormality. BONES AND SOFT TISSUES: No acute osseous abnormality. No focal soft tissue abnormality. IMPRESSION: 1. No acute abnormality in the abdomen or pelvis. 2. Patchy centrilobular ground glass opacities in the left greater than right lower lungs compatible with small airway infection / inflammation. Electronically signed by: Norman Gatlin MD 10/08/2024 08:02 PM EST RP Workstation: [id number]        Scheduled Meds:  amLODipine  10 mg Oral Daily   carvedilol  25 mg Oral BID   guaiFENesin  600 mg Oral BID    hydrALAZINE  25 mg Oral Q8H   pantoprazole  40 mg Oral q morning   Continuous Infusions:  sodium chloride 100 mL/hr (10/09/24 0119)   azithromycin Stopped (10/09/24 0329)   cefTRIAXone  (ROCEPHIN )  IV       LOS: 1 day      Calvin KATHEE Robson, MD [facility]   If 7PM-7AM, please contact night-coverage  10/09/2024, 11:45 AM

## 2024-10-09 NOTE — TOC CM/SW Note (Signed)
 Transition of Care [facility]) CM/SW Note    Transition of Care [facility]) - Inpatient Brief Assessment   Patient Details  Name: Michael Arnold MRN: [id number] Date of Birth: 03-09-1986  [facility] CM/SW Contact:    Alfonso Rummer, LCSW Phone Number: 10/09/2024, 5:10 PM   Clinical Narrative: Completed TOC chart review No TOC needs at this time. Please contact should needs arise    Transition of Care Asessment: Insurance and Status: Insurance coverage has been reviewed Patient has primary care physician: Yes ([facility]) Home environment has been reviewed: single family home   Prior/Current Home Services: No current home services Social Drivers of Health Review: SDOH reviewed no interventions necessary Readmission risk has been reviewed: No Transition of care needs: no transition of care needs at this time

## 2024-10-09 NOTE — Consult Note (Signed)
 Urology Consult  I have been asked to see the patient by Dr. Madison Peaches, for evaluation and management of left flank and hematuria.  Chief Complaint: Flank pain and hematuria  History of Present Illness: Michael Arnold is a 38 y.o. year old man who presented to the emergency department after having gross hematuria for 2 days and then started having left flank pain.  He states that he feels like he passed a kidney stone.  He does not have a personal history of kidney stones, but there is family history of kidney stones.  He has also noted some blood in his ejaculate.  He does have a history of hypertension that had not discontinued his blood pressure medication a week ago after his brother died.  CT renal stone study and contrast CT was performed in the emergency department and there were no findings for renal masses, hydronephrosis or stones.  Serum creatinine was 2.01, CBC was normal, and urinalysis was yellow clear with 11-20 RBC's.  His blood pressure was 205/146 in the ED.  He has been admitted for hypertensive urgency, community-acquired pneumonia, AKI, and hematuria.  Today, he reports he is no longer seeing blood in his urine.  Patient denies any modifying or aggravating factors.  Patient denies any recent UTI's, gross hematuria, dysuria or suprapubic/flank pain.  Patient denies any fevers, chills, nausea or vomiting.    Patient is still hypertensive with tachypnea.  His CBC continues to be within normal range.  His serum creatinine is improving to 1.62 this morning.  Past Medical History:  Diagnosis Date   Hypertension     Past Surgical History:  Procedure Laterality Date   KNEE SURGERY      Home Medications:  Current Outpatient Medications  Medication Instructions   amLODipine (NORVASC) 10 mg, Daily   carvedilol (COREG) 12.5 mg, Oral, 2 times daily   hydrochlorothiazide (HYDRODIURIL) 25 mg, Every morning   metoprolol succinate (TOPROL-XL) 25 mg, Daily   pantoprazole  (PROTONIX) 40 mg, Oral, Every morning    Allergies: No Known Allergies  No family history on file.  Social History:  reports that he has never smoked. He does not have any smokeless tobacco history on file. He reports that he does not drink alcohol and does not use drugs.  ROS: A complete review of systems was performed.  All systems are negative except for pertinent findings as noted.  Physical Exam:  Vital signs in last 24 hours: Temp:  [97.7 F (36.5 C)-97.9 F (36.6 C)] 97.9 F (36.6 C) (12/01 0331) Pulse Rate:  [71-84] 77 (12/01 0830) Resp:  [16-31] 31 (12/01 0830) BP: (156-205)/(118-151) 171/122 (12/01 0830) SpO2:  [94 %-98 %] 98 % (12/01 0830) Constitutional:  Alert and oriented, No acute distress HEENT: [place] AT, moist mucus membranes.  Trachea midline Cardiovascular: No clubbing, cyanosis, or edema. Respiratory: Tachypneic GU: No CVA tenderness Neurologic: Grossly intact, no focal deficits, moving all 4 extremities Psychiatric: Normal mood and affect   Laboratory Data:  Recent Labs    10/08/24 1916 10/09/24 0409  WBC 6.5 5.9  HGB 14.6 14.8  HCT 43.0 43.5   Recent Labs    10/08/24 1916 10/09/24 0409  NA 143 141  K 3.6 3.4*  CL 104 105  CO2 29 26  GLUCOSE 87 103*  BUN 24* 20  CREATININE 2.01* 1.62*  CALCIUM 9.7 8.8*   No results for input(s): LABPT, INR in the last 72 hours. No results for input(s): LABURIN in the last 72  hours. Results for orders placed or performed in visit on 08/15/19  Novel Coronavirus, NAA ([facility])     Status: None   Collection Time: 08/15/19 12:00 AM   Specimen: Nasopharyngeal(NP) swabs in vial transport medium   NASOPHARYNGE  TESTING  Result Value Ref Range Status   SARS-CoV-2, NAA Not Detected Not Detected Final    Comment: Testing was performed using the cobas(R) SARS-CoV-2 test. This nucleic acid amplification test was developed and its performance characteristics determined by [employer]. Nucleic  acid amplification tests include PCR and TMA. This test has not been FDA cleared or approved. This test has been authorized by FDA under an Emergency Use Authorization (EUA). This test is only authorized for the duration of time the declaration that circumstances exist justifying the authorization of the emergency use of in vitro diagnostic tests for detection of SARS-CoV-2 virus and/or diagnosis of COVID-19 infection under section 564(b)(1) of the Act, 21 U.S.C. 639aaa-6(a) (1), unless the authorization is terminated or revoked sooner. When diagnostic testing is negative, the possibility of a false negative result should be considered in the context of a patient's recent exposures and the presence of clinical signs and symptoms consistent with COVID-19. An individual without symptoms  of COVID-19 and who is not shedding SARS-CoV-2 virus would expect to have a negative (not detected) result in this assay.      Radiologic Imaging: CT ABDOMEN PELVIS W CONTRAST Result Date: 10/08/2024 CLINICAL DATA:  Left flank pain EXAM: CT ABDOMEN AND PELVIS WITH CONTRAST TECHNIQUE: Multidetector CT imaging of the abdomen and pelvis was performed using the standard protocol following bolus administration of intravenous contrast. RADIATION DOSE REDUCTION: This exam was performed according to the departmental dose-optimization program which includes automated exposure control, adjustment of the mA and/or kV according to patient size and/or use of iterative reconstruction technique. CONTRAST:  80mL OMNIPAQUE IOHEXOL 300 MG/ML  SOLN COMPARISON:  CT renal stone 10/08/2024. FINDINGS: Lower chest: Patchy airspace and ground-glass opacities are seen in both lung bases, left greater than right. This is unchanged. Hepatobiliary: No focal liver abnormality is seen. No gallstones, gallbladder wall thickening, or biliary dilatation. Pancreas: Unremarkable. No pancreatic ductal dilatation or surrounding inflammatory changes.  Spleen: Normal in size without focal abnormality. Adrenals/Urinary Tract: The left kidney, adrenal glands and bladder appear within normal limits. There is right renal atrophy and scarring. Stomach/Bowel: There is a small hiatal hernia. Stomach is within normal limits. Appendix appears normal. No evidence of bowel wall thickening, distention, or inflammatory changes. Vascular/Lymphatic: No significant vascular findings are present. No enlarged abdominal or pelvic lymph nodes. Reproductive: Prostate is unremarkable. Other: No abdominal wall hernia or abnormality. No abdominopelvic ascites. Musculoskeletal: No acute or significant osseous findings. IMPRESSION: 1. No acute localizing process in the abdomen or pelvis. 2. Right renal atrophy and scarring. 3. Small hiatal hernia. 4. Stable patchy airspace and ground-glass opacities in both lung bases, left greater than right, compatible with infection. Electronically Signed   By: Greig Pique M.D.   On: 10/08/2024 22:56   DG Chest 2 View Result Date: 10/08/2024 EXAM: 2 VIEW(S) XRAY OF THE CHEST 10/08/2024 08:42:00 PM COMPARISON: 6 / 20 / 19. CLINICAL HISTORY: infiltrates? Flank  pain FINDINGS: LUNGS AND PLEURA: Left basilar patchy opacities. No pleural effusion. No pneumothorax. HEART AND MEDIASTINUM: No acute abnormality of the cardiac and mediastinal silhouettes. BONES AND SOFT TISSUES: No acute osseous abnormality. IMPRESSION: 1. Left basilar patchy opacities which may reflect aspiration or pneumonia. Follow up in 6 - 8 weeks after  treatment is recommended to ensure resolution. Electronically signed by: Norman Gatlin MD 10/08/2024 08:51 PM EST RP Workstation: [id number]   CT Renal Stone Study Result Date: 10/08/2024 EXAM: CT UROGRAM 10/08/2024 07:54:48 PM TECHNIQUE: CT of the abdomen and pelvis was performed without the administration of intravenous contrast as per CT urogram protocol. Automated exposure control, iterative reconstruction, and/or weight based  adjustment of the mA/kV was utilized to reduce the radiation dose to as low as reasonably achievable. COMPARISON: None available. CLINICAL HISTORY: Abdominal/flank pain, stone suspected. FINDINGS: LOWER CHEST: Patchy centrilobular ground glass opacities in the left greater than right lower lungs. LIVER: The liver is unremarkable. GALLBLADDER AND BILE DUCTS: Gallbladder is unremarkable. No biliary ductal dilatation. SPLEEN: No acute abnormality. PANCREAS: No acute abnormality. ADRENAL GLANDS: No acute abnormality. KIDNEYS, URETERS AND BLADDER: No stones in the kidneys or ureters. No hydronephrosis. No perinephric or periureteral stranding. Urinary bladder is unremarkable. GI AND BOWEL: Stomach demonstrates no acute abnormality. There is no bowel obstruction. Normal appendix. PERITONEUM AND RETROPERITONEUM: No ascites. No free air. VASCULATURE: Aorta is normal in caliber. LYMPH NODES: No lymphadenopathy. REPRODUCTIVE ORGANS: No acute abnormality. BONES AND SOFT TISSUES: No acute osseous abnormality. No focal soft tissue abnormality. IMPRESSION: 1. No acute abnormality in the abdomen or pelvis. 2. Patchy centrilobular ground glass opacities in the left greater than right lower lungs compatible with small airway infection / inflammation. Electronically signed by: Norman Gatlin MD 10/08/2024 08:02 PM EST RP Workstation: [id number]    Impression/Assessment:  38 year old male with left-sided flank pain and gross hematuria who presented to the ED for further evaluation and was found to be in hypertensive urgency, AKI and with community-acquired pneumonia.  His flank pain and gross hematuria had resolved overnight.  -CT renal stone study did not identify any nephrolithiasis to explain the hematuria  -Contrast renal stone study did not notify any renal masses  -AKI is improving and likely due to the uncontrolled hypertension  Plan:  -No urological intervention is warranted at this time -He will need a formal  workup for his gross hematuria as an outpatient with CT urogram and cystoscopy once AKI has resolved -we will contact the patient to schedule these appointments    10/09/2024, 10:00 AM  Michael Buxton, PA-C

## 2024-10-09 NOTE — Assessment & Plan Note (Signed)
 Continue PPI therapy.

## 2024-10-10 ENCOUNTER — Other Ambulatory Visit: Payer: Self-pay

## 2024-10-10 DIAGNOSIS — I160 Hypertensive urgency: Secondary | ICD-10-CM | POA: Diagnosis not present

## 2024-10-10 LAB — BASIC METABOLIC PANEL WITH GFR
Anion gap: 7 (ref 5–15)
BUN: 19 mg/dL (ref 6–20)
CO2: 27 mmol/L (ref 22–32)
Calcium: 8.9 mg/dL (ref 8.9–10.3)
Chloride: 108 mmol/L (ref 98–111)
Creatinine, Ser: 1.87 mg/dL — ABNORMAL HIGH (ref 0.61–1.24)
GFR, Estimated: 47 mL/min — ABNORMAL LOW (ref >60)
Glucose, Bld: 95 mg/dL (ref 70–99)
Potassium: 3.5 mmol/L (ref 3.5–5.1)
Sodium: 142 mmol/L (ref 135–145)

## 2024-10-10 LAB — CBC WITH DIFFERENTIAL/PLATELET
Abs Immature Granulocytes: 0.01 K/uL (ref 0.00–0.07)
Basophils Absolute: 0 K/uL (ref 0.0–0.1)
Basophils Relative: 1 %
Eosinophils Absolute: 0.4 K/uL (ref 0.0–0.5)
Eosinophils Relative: 8 %
HCT: 43.4 % (ref 39.0–52.0)
Hemoglobin: 14.4 g/dL (ref 13.0–17.0)
Immature Granulocytes: 0 %
Lymphocytes Relative: 21 %
Lymphs Abs: 1.2 K/uL (ref 0.7–4.0)
MCH: 28.3 pg (ref 26.0–34.0)
MCHC: 33.2 g/dL (ref 30.0–36.0)
MCV: 85.3 fL (ref 80.0–100.0)
Monocytes Absolute: 0.6 K/uL (ref 0.1–1.0)
Monocytes Relative: 11 %
Neutro Abs: 3.5 K/uL (ref 1.7–7.7)
Neutrophils Relative %: 59 %
Platelets: 232 K/uL (ref 150–400)
RBC: 5.09 MIL/uL (ref 4.22–5.81)
RDW: 13.2 % (ref 11.5–15.5)
WBC: 5.8 K/uL (ref 4.0–10.5)
nRBC: 0 % (ref 0.0–0.2)

## 2024-10-10 MED ORDER — HYDRALAZINE HCL 50 MG PO TABS
50.0000 mg | ORAL_TABLET | Freq: Three times a day (TID) | ORAL | 0 refills | Status: AC
  Filled 2024-10-10: qty 90, 30d supply, fill #0

## 2024-10-10 MED ORDER — CARVEDILOL 25 MG PO TABS
25.0000 mg | ORAL_TABLET | Freq: Two times a day (BID) | ORAL | 0 refills | Status: AC
  Filled 2024-10-10: qty 60, 30d supply, fill #0

## 2024-10-10 MED ORDER — AMLODIPINE BESYLATE 10 MG PO TABS
10.0000 mg | ORAL_TABLET | Freq: Every day | ORAL | 0 refills | Status: AC
  Filled 2024-10-10: qty 30, 30d supply, fill #0

## 2024-10-10 MED ORDER — HYDRALAZINE HCL 50 MG PO TABS: 50.0000 mg | ORAL_TABLET | Freq: Three times a day (TID) | ORAL | Status: DC

## 2024-10-10 NOTE — Plan of Care (Signed)
  Problem: Education: Goal: Knowledge of General Education information will improve Description: Including pain rating scale, medication(s)/side effects and non-pharmacologic comfort measures Outcome: Adequate for Discharge   Problem: Health Behavior/Discharge Planning: Goal: Ability to manage health-related needs will improve Outcome: Adequate for Discharge   Problem: Clinical Measurements: Goal: Ability to maintain clinical measurements within normal limits will improve Outcome: Adequate for Discharge Goal: Will remain free from infection Outcome: Adequate for Discharge Goal: Diagnostic test results will improve Outcome: Adequate for Discharge Goal: Respiratory complications will improve Outcome: Adequate for Discharge Goal: Cardiovascular complication will be avoided Outcome: Adequate for Discharge   Problem: Activity: Goal: Ability to tolerate increased activity will improve Outcome: Adequate for Discharge   Problem: Clinical Measurements: Goal: Ability to maintain a body temperature in the normal range will improve Outcome: Adequate for Discharge   Problem: Respiratory: Goal: Ability to maintain adequate ventilation will improve Outcome: Adequate for Discharge Goal: Ability to maintain a clear airway will improve Outcome: Adequate for Discharge

## 2024-10-10 NOTE — Plan of Care (Signed)
 IV removed, discharge instructions reviewed, medication delivered and patient will discharge to home.  Patient has been advised to follow up with his PCP due to elevated BP, urology will contact patient regarding following up appointment.  Patient given information to contact urology if he does not here from them.

## 2024-10-10 NOTE — Discharge Summary (Signed)
 Physician Discharge Summary  Michael Arnold FMW:[id number] DOB: 02-Jun-1986 DOA: 10/08/2024  PCP: [facility]  Admit date: 10/08/2024 Discharge date: 10/10/2024  Admitted From: Home Disposition:  Home  Recommendations for Outpatient Follow-up:  Follow up with PCP in 1-2 weeks Ambulatory referral to urology  Home Health: No Equipment/Devices: None  Discharge Condition: Stable CODE STATUS: Full Diet recommendation: Heart healthy  Brief/Interim Summary:  38 y.o. African-American male with medical history significant for essential hypertension, who presented to the emergency room with acute onset of left flank pain and hematuria.  His hematuria started last Tuesday after which he started having left flank pain.  He thought he did pass a kidney stone that looked like gravel.  He never had a history of urolithiasis however he admits to family history.  His urine cleared in the urine when he noticed blood with ejaculation.  He later had hematuria again with left flank pain.  He has not taken his blood pressure medications regularly over the last week because his brother died last week and he stated that he had other things on his mind.  No fever or chills.  No nausea or vomiting or abdominal pain.  No chest pain or palpitations.  He admitted to occasional cough that is mainly dry without wheezing.  He had dyspnea with pain.  No dysuria, oliguria or hematuria or flank pain.  No melena or bright red bleeding per rectum.  No other bleeding diathesis.   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  CT with contrast negative for renal infarct.  CT renal stone study did not show  any active stones. Plan: Blood pressure improved but not yet at goal.  Given otherwise hemodynamically stable will discharge home on current regimen of carvedilol 25 mg twice daily amlodipine 10 mg daily and hydralazine 50 mg 3 times daily.  Counseled patient on importance of medication adherence and PCP follow-up.  Gross hematuria Possibly secondary to passed stone.  Urology consulted.  No acute urologic intervention necessary.  Outpatient follow-up to be scheduled   Possible Community-acquired pneumonia Infiltrate noted on chest x-ray however patient does not present with any infectious symptoms.  Possible that this is incidental finding versus basilar atelectasis Plan: No indication of infectious etiology.  Discontinue antibiotics   GERD PPI   Discharge Instructions  Discharge Instructions     Diet - low sodium heart healthy   Complete by: As directed    Increase activity slowly   Complete by: As directed       Allergies as of 10/10/2024   No Known Allergies      Medication List     STOP taking these medications    hydrochlorothiazide 25 MG tablet Commonly known as: HYDRODIURIL   metoprolol succinate 25 MG 24 hr tablet Commonly known as: TOPROL-XL       TAKE these medications    amLODipine 10 MG tablet Commonly known as: NORVASC Take 1 tablet (10 mg total) by mouth daily.   carvedilol 25 MG tablet Commonly known as: COREG Take 1 tablet (25 mg total) by mouth 2 (two) times daily. What changed:  medication strength how much to take   hydrALAZINE 50 MG tablet Commonly known as: APRESOLINE Take 1 tablet (50  mg total) by mouth every 8 (eight) hours.   pantoprazole 40 MG tablet Commonly known as: PROTONIX Take 40 mg by mouth every morning.        Follow-up Information     [facility]. Schedule an appointment as soon as possible for a visit in 1 week(s).   Specialty: Family Medicine Contact information: [address]  [address] [place] [address] [phone]         Helon Kirsch A, PA-C Follow up.   Specialty: Urology Why: Urology will set up outpatient followup for you.  Please contact office if you do not hear about appointment details in the next 4-5 days Contact information: [address] [place] [address] [phone]                No Known Allergies  Consultations: None   Procedures/Studies: CT ABDOMEN PELVIS W CONTRAST Result Date: 10/08/2024 CLINICAL DATA:  Left flank pain EXAM: CT ABDOMEN AND PELVIS WITH CONTRAST TECHNIQUE: Multidetector CT imaging of the abdomen and pelvis was performed using the standard protocol following bolus administration of intravenous contrast. RADIATION DOSE REDUCTION: This exam was performed according to the departmental dose-optimization program which includes automated exposure control, adjustment of the mA and/or kV according to patient size and/or use of iterative reconstruction technique. CONTRAST:  80mL OMNIPAQUE IOHEXOL 300 MG/ML  SOLN COMPARISON:  CT renal stone 10/08/2024. FINDINGS: Lower chest: Patchy airspace and ground-glass opacities are seen in both lung bases, left greater than right. This is unchanged. Hepatobiliary: No focal liver abnormality is seen. No gallstones, gallbladder wall thickening, or biliary dilatation. Pancreas: Unremarkable. No pancreatic ductal dilatation or surrounding inflammatory changes. Spleen: Normal in size without focal abnormality. Adrenals/Urinary Tract: The left kidney, adrenal glands and bladder appear within normal limits. There is right renal atrophy and scarring. Stomach/Bowel: There is a small hiatal hernia. Stomach is within normal limits. Appendix appears normal. No evidence of bowel wall thickening, distention, or inflammatory changes. Vascular/Lymphatic: No significant vascular findings are present. No enlarged abdominal or pelvic lymph nodes. Reproductive:  Prostate is unremarkable. Other: No abdominal wall hernia or abnormality. No abdominopelvic ascites. Musculoskeletal: No acute or significant osseous findings. IMPRESSION: 1. No acute localizing process in the abdomen or pelvis. 2. Right renal atrophy and scarring. 3. Small hiatal hernia. 4. Stable patchy airspace and ground-glass opacities in both lung bases, left greater than right, compatible with infection. Electronically Signed   By: Greig Pique M.D.   On: 10/08/2024 22:56   DG Chest 2 View Result Date: 10/08/2024 EXAM: 2 VIEW(S) XRAY OF THE CHEST 10/08/2024 08:42:00 PM COMPARISON: 6 / 20 / 19. CLINICAL HISTORY: infiltrates? Flank  pain FINDINGS: LUNGS AND PLEURA: Left basilar patchy opacities. No pleural effusion. No pneumothorax. HEART AND MEDIASTINUM: No acute abnormality of the cardiac and mediastinal silhouettes. BONES AND SOFT TISSUES: No acute osseous abnormality. IMPRESSION: 1. Left basilar patchy opacities which may reflect aspiration or pneumonia. Follow up in 6 - 8 weeks after treatment is recommended to ensure resolution. Electronically signed by: Norman Gatlin MD 10/08/2024 08:51 PM EST RP Workstation: [id number]   CT Renal Stone Study Result Date: 10/08/2024 EXAM: CT UROGRAM 10/08/2024 07:54:48 PM TECHNIQUE: CT of the abdomen and pelvis was performed without the administration of intravenous contrast as per CT urogram protocol. Automated exposure control, iterative reconstruction, and/or weight based adjustment of the mA/kV was utilized to reduce the radiation dose to as low as reasonably achievable. COMPARISON: None available. CLINICAL  HISTORY: Abdominal/flank pain, stone suspected. FINDINGS: LOWER CHEST: Patchy centrilobular ground glass opacities in the left greater than right lower lungs. LIVER: The liver is unremarkable. GALLBLADDER AND BILE DUCTS: Gallbladder is unremarkable. No biliary ductal dilatation. SPLEEN: No acute abnormality. PANCREAS: No acute abnormality. ADRENAL  GLANDS: No acute abnormality. KIDNEYS, URETERS AND BLADDER: No stones in the kidneys or ureters. No hydronephrosis. No perinephric or periureteral stranding. Urinary bladder is unremarkable. GI AND BOWEL: Stomach demonstrates no acute abnormality. There is no bowel obstruction. Normal appendix. PERITONEUM AND RETROPERITONEUM: No ascites. No free air. VASCULATURE: Aorta is normal in caliber. LYMPH NODES: No lymphadenopathy. REPRODUCTIVE ORGANS: No acute abnormality. BONES AND SOFT TISSUES: No acute osseous abnormality. No focal soft tissue abnormality. IMPRESSION: 1. No acute abnormality in the abdomen or pelvis. 2. Patchy centrilobular ground glass opacities in the left greater than right lower lungs compatible with small airway infection / inflammation. Electronically signed by: Norman Gatlin MD 10/08/2024 08:02 PM EST RP Workstation: [id number]      Subjective:   Discharge Exam: Vitals:   10/10/24 0845 10/10/24 0946  BP: (!) 164/114 (!) 161/109  Pulse:    Resp:    Temp:    SpO2:     Vitals:   10/10/24 0406 10/10/24 0802 10/10/24 0845 10/10/24 0946  BP: (!) 159/96 (!) 180/122 (!) 164/114 (!) 161/109  Pulse: 74 78    Resp: 17 18    Temp: (!) 97.5 F (36.4 C) 98.4 F (36.9 C)    TempSrc:      SpO2: 100% 100%    Weight:      Height:        General: Pt is alert, awake, not in acute distress Cardiovascular: RRR, S1/S2 +, no rubs, no gallops Respiratory: CTA bilaterally, no wheezing, no rhonchi Abdominal: Soft, NT, ND, bowel sounds + Extremities: no edema, no cyanosis    The results of significant diagnostics from this hospitalization (including imaging, microbiology, ancillary and laboratory) are listed below for reference.     Microbiology: No results found for this or any previous visit (from the past 240 hours).   Labs: BNP (last 3 results) No results for input(s): BNP in the last 8760 hours. Basic Metabolic Panel: Recent Labs  Lab 10/08/24 1916 10/09/24 0409  10/10/24 0740  NA 143 141 142  K 3.6 3.4* 3.5  CL 104 105 108  CO2 29 26 27   GLUCOSE 87 103* 95  BUN 24* 20 19  CREATININE 2.01* 1.62* 1.87*  CALCIUM 9.7 8.8* 8.9   Liver Function Tests: Recent Labs  Lab 10/08/24 1916  AST 25  ALT 15  ALKPHOS 78  BILITOT 0.2  PROT 7.4  ALBUMIN 4.4   No results for input(s): LIPASE, AMYLASE in the last 168 hours. No results for input(s): AMMONIA in the last 168 hours. CBC: Recent Labs  Lab 10/08/24 1916 10/09/24 0409 10/10/24 0740  WBC 6.5 5.9 5.8  NEUTROABS  --   --  3.5  HGB 14.6 14.8 14.4  HCT 43.0 43.5 43.4  MCV 84.8 84.3 85.3  PLT 241 215 232   Cardiac Enzymes: No results for input(s): CKTOTAL, CKMB, CKMBINDEX, TROPONINI in the last 168 hours. BNP: Invalid input(s): POCBNP CBG: No results for input(s): GLUCAP in the last 168 hours. D-Dimer No results for input(s): DDIMER in the last 72 hours. Hgb A1c No results for input(s): HGBA1C in the last 72 hours. Lipid Profile No results for input(s): CHOL, HDL, LDLCALC, TRIG, CHOLHDL, LDLDIRECT in the last 72 hours. Thyroid  function studies No results for input(s): TSH, T4TOTAL, T3FREE, THYROIDAB in the last 72 hours.  Invalid input(s): FREET3 Anemia work up No results for input(s): VITAMINB12, FOLATE, FERRITIN, TIBC, IRON, RETICCTPCT in the last 72 hours. Urinalysis    Component Value Date/Time   COLORURINE YELLOW (A) 10/08/2024 1916   APPEARANCEUR CLEAR (A) 10/08/2024 1916   LABSPEC 1.016 10/08/2024 1916   PHURINE 5.0 10/08/2024 1916   GLUCOSEU NEGATIVE 10/08/2024 1916   HGBUR LARGE (A) 10/08/2024 1916   BILIRUBINUR NEGATIVE 10/08/2024 1916   KETONESUR NEGATIVE 10/08/2024 1916   PROTEINUR 30 (A) 10/08/2024 1916   NITRITE NEGATIVE 10/08/2024 1916   LEUKOCYTESUR NEGATIVE 10/08/2024 1916   Sepsis Labs Recent Labs  Lab 10/08/24 1916 10/09/24 0409 10/10/24 0740  WBC 6.5 5.9 5.8   Microbiology No results  found for this or any previous visit (from the past 240 hours).   Time coordinating discharge: 40 minutes  SIGNED:   Calvin KATHEE Robson, MD  [facility] 10/10/2024, 2:19 PM Pager   If 7PM-7AM, please contact night-coverage

## 2024-10-14 NOTE — Progress Notes (Deleted)
 10/17/2024 9:30 PM   Kamran Nevels 1986/03/06 [id number]  Referring provider: [facility] [address] [place],  [place] [address]  Urological history: 1.   No chief complaint on file.  HPI: Dastan Krider is a 38 y.o. man who presents today for hospital follow up.    Previous records reviewed.  I PSS ***  He reports sensation of incomplete bladder emptying,   urinary frequency,   urinary intermittency,   urinary urgency,   a weak urinary stream,   having to strain to void,   nocturia x ***,   leaking before being able to reach the restroom,   leaking with coughing,   leaking without awareness,   and post void dribbling.     He is wearing *** pads//depends  daily.    Patient denies any modifying or aggravating factors.  Patient denies any recent UTI's, gross hematuria, dysuria or suprapubic/flank pain.  Patient denies any fevers, chills, nausea or vomiting.  ***  He has a family history of PCa, colon cancer, ovarian cancer and/or breast cancer with ***.   He does not have a family history of PCa, colon cancer, ovarian cancer, and/or breast cancer .***     UA***  PVR***  Serum creatinine (10/2024) 1.87  PMH: Past Medical History:  Diagnosis Date   Hypertension     Surgical History: Past Surgical History:  Procedure Laterality Date   KNEE SURGERY      Home Medications:  Allergies as of 10/17/2024   No Known Allergies      Medication List        Accurate as of October 14, 2024  9:30 PM. If you have any questions, ask your nurse or doctor.          amLODipine  10 MG tablet Commonly known as: NORVASC  Take 1 tablet (10 mg total) by mouth daily.   carvedilol  25 MG tablet Commonly known as: COREG  Take 1 tablet (25 mg total) by mouth 2 (two) times daily.   hydrALAZINE  50 MG tablet Commonly known as: APRESOLINE  Take 1 tablet (50 mg total) by mouth every 8 (eight) hours.    pantoprazole  40 MG tablet Commonly known as: PROTONIX  Take 40 mg by mouth every morning.        Allergies: No Known Allergies  Family History: No family history on file.  Social History:  reports that he has never smoked. He does not have any smokeless tobacco history on file. He reports that he does not drink alcohol and does not use drugs.  ROS: Pertinent ROS in HPI  Physical Exam: There were no vitals taken for this visit.  Constitutional:  Well nourished. Alert and oriented, No acute distress. HEENT: [place] AT, moist mucus membranes.  Trachea midline, no masses. Cardiovascular: No clubbing, cyanosis, or edema. Respiratory: Normal respiratory effort, no increased work of breathing. GI: Abdomen is soft, non tender, non distended, no abdominal masses. Liver and spleen not palpable.  No hernias appreciated.  Stool sample for occult testing is not indicated.   GU: No CVA tenderness.  No bladder fullness or masses.  Patient with circumcised/uncircumcised phallus. ***Foreskin easily retracted***  Urethral meatus is patent.  No penile discharge. No penile lesions or rashes. Scrotum without lesions, cysts, rashes and/or edema.  Testicles are located scrotally bilaterally. No masses are appreciated in the testicles. Left and right epididymis are normal. Rectal: Patient with  normal sphincter tone. Anus and perineum without scarring or rashes. No rectal  masses are appreciated. Prostate is approximately *** grams, *** nodules are appreciated. Seminal vesicles are normal. Skin: No rashes, bruises or suspicious lesions. Lymph: No cervical or inguinal adenopathy. Neurologic: Grossly intact, no focal deficits, moving all 4 extremities. Psychiatric: Normal mood and affect.  Laboratory Data: See Epic and HPI   I have reviewed the labs.   Pertinent Imaging: CLINICAL DATA:  Left flank pain   EXAM: CT ABDOMEN AND PELVIS WITH CONTRAST   TECHNIQUE: Multidetector CT imaging of the abdomen and  pelvis was performed using the standard protocol following bolus administration of intravenous contrast.   RADIATION DOSE REDUCTION: This exam was performed according to the departmental dose-optimization program which includes automated exposure control, adjustment of the mA and/or kV according to patient size and/or use of iterative reconstruction technique.   CONTRAST:  80mL OMNIPAQUE  IOHEXOL  300 MG/ML  SOLN   COMPARISON:  CT renal stone 10/08/2024.   FINDINGS: Lower chest: Patchy airspace and ground-glass opacities are seen in both lung bases, left greater than right. This is unchanged.   Hepatobiliary: No focal liver abnormality is seen. No gallstones, gallbladder wall thickening, or biliary dilatation.   Pancreas: Unremarkable. No pancreatic ductal dilatation or surrounding inflammatory changes.   Spleen: Normal in size without focal abnormality.   Adrenals/Urinary Tract: The left kidney, adrenal glands and bladder appear within normal limits. There is right renal atrophy and scarring.   Stomach/Bowel: There is a small hiatal hernia. Stomach is within normal limits. Appendix appears normal. No evidence of bowel wall thickening, distention, or inflammatory changes.   Vascular/Lymphatic: No significant vascular findings are present. No enlarged abdominal or pelvic lymph nodes.   Reproductive: Prostate is unremarkable.   Other: No abdominal wall hernia or abnormality. No abdominopelvic ascites.   Musculoskeletal: No acute or significant osseous findings.   IMPRESSION: 1. No acute localizing process in the abdomen or pelvis. 2. Right renal atrophy and scarring. 3. Small hiatal hernia. 4. Stable patchy airspace and ground-glass opacities in both lung bases, left greater than right, compatible with infection.     Electronically Signed   By: Greig Pique M.D.   On: 10/08/2024 22:56 EXAM: CT UROGRAM 10/08/2024 07:54:48 PM   TECHNIQUE: CT of the abdomen and  pelvis was performed without the administration of intravenous contrast as per CT urogram protocol. Automated exposure control, iterative reconstruction, and/or weight based adjustment of the mA/kV was utilized to reduce the radiation dose to as low as reasonably achievable.   COMPARISON: None available.   CLINICAL HISTORY: Abdominal/flank pain, stone suspected.   FINDINGS:   LOWER CHEST: Patchy centrilobular ground glass opacities in the left greater than right lower lungs. LIVER: The liver is unremarkable.   GALLBLADDER AND BILE DUCTS: Gallbladder is unremarkable. No biliary ductal dilatation.   SPLEEN: No acute abnormality.   PANCREAS: No acute abnormality.   ADRENAL GLANDS: No acute abnormality.   KIDNEYS, URETERS AND BLADDER: No stones in the kidneys or ureters. No hydronephrosis. No perinephric or periureteral stranding. Urinary bladder is unremarkable.   GI AND BOWEL: Stomach demonstrates no acute abnormality. There is no bowel obstruction. Normal appendix.   PERITONEUM AND RETROPERITONEUM: No ascites. No free air.   VASCULATURE: Aorta is normal in caliber.   LYMPH NODES: No lymphadenopathy.   REPRODUCTIVE ORGANS: No acute abnormality.   BONES AND SOFT TISSUES: No acute osseous abnormality. No focal soft tissue abnormality.   IMPRESSION: 1. No acute abnormality in the abdomen or pelvis. 2. Patchy centrilobular ground glass opacities in the left  greater than right lower lungs compatible with small airway infection / inflammation.   Electronically signed by: Norman Gatlin MD 10/08/2024 08:02 PM EST RP Workstation: [id number] I have independently reviewed the films.  See HPI.   Assessment & Plan:  ***  1. Gross hematuria -We reviewed that hematuria (blood in the urine) may result from a variety of causes, including nephrolithiasis (kidney stones), benign prostatic hyperplasia (BPH), urinary tract infections (UTIs), trauma or structural  abnormalities of the urinary tract, and malignancy. It was noted that in some cases, no definitive etiology is identified despite thorough evaluation. -We discussed that a CT urogram involves intravenous administration of contrast material to enhance imaging of the urinary tract, but he had two CT's while hospitalize which had no worrisome findings, we will hold on pursuing a CT urogram as his renal function is still compromised -Following imaging, cystoscopy will be performed. This procedure involves passing a small camera through the urethra into the bladder after administration of topical lidocaine  for local anesthesia. Post-procedural symptoms may include mild hematuria and dysuria, typically resolving within 24 to 48 hours. -The patient was given the opportunity to ask questions, which were addressed in detail. After reviewing the risks, benefits, and rationale for evaluation, the patient expressed understanding and agreed to proceed. Orders Placed: -Cystoscopy  - Urinalysis (UA)  - Urine culture  - Basic metabolic panel (BMP) or recent serum creatinine  Plan: The patient will return for follow-up after completion of the above studies to review findings and determine next steps.    No follow-ups on file.  These notes generated with voice recognition software. I apologize for typographical errors.  CLOTILDA HELON RIGGERS  [facility] [address]  [address] [place], [place] [address] [phone]

## 2024-10-17 ENCOUNTER — Ambulatory Visit: Admitting: Urology

## 2024-10-17 DIAGNOSIS — R310 Gross hematuria: Secondary | ICD-10-CM

## 2024-11-27 ENCOUNTER — Telehealth: Payer: Self-pay | Admitting: Urology

## 2024-11-27 ENCOUNTER — Encounter: Payer: Self-pay | Admitting: Urology

## 2024-11-27 NOTE — Telephone Encounter (Signed)
 We saw this gentleman in the hospital for gross hematuria and he needs an outpatient workup.  He was scheduled for 12/09, but we cancelled his appointment because of the weather.   Would you call him and get him rescheduled?
